# Patient Record
Sex: Female | Born: 1955 | Race: Black or African American | Hispanic: No | Marital: Married | Smoking: Former smoker
Health system: Southern US, Community
[De-identification: ages and names within clinical notes are randomized; demographics above are authoritative.]

## PROBLEM LIST (undated history)

## (undated) DIAGNOSIS — T7840XA Allergy, unspecified, initial encounter: Secondary | ICD-10-CM

## (undated) HISTORY — PX: FOOT SURGERY: SHX648

## (undated) HISTORY — DX: Allergy, unspecified, initial encounter: T78.40XA

---

## 2001-06-27 HISTORY — PX: ABDOMINAL HYSTERECTOMY: SHX81

## 2003-06-28 HISTORY — PX: BACK SURGERY: SHX140

## 2004-04-23 ENCOUNTER — Ambulatory Visit: Payer: Self-pay | Admitting: Specialist

## 2006-01-11 ENCOUNTER — Emergency Department: Payer: Self-pay | Admitting: Emergency Medicine

## 2006-02-02 ENCOUNTER — Encounter: Payer: Self-pay | Admitting: Emergency Medicine

## 2007-04-12 ENCOUNTER — Encounter: Payer: Self-pay | Admitting: Rheumatology

## 2007-04-28 ENCOUNTER — Encounter: Payer: Self-pay | Admitting: Rheumatology

## 2007-09-10 ENCOUNTER — Ambulatory Visit: Payer: Self-pay | Admitting: Internal Medicine

## 2012-03-27 ENCOUNTER — Ambulatory Visit: Payer: Self-pay

## 2012-09-17 ENCOUNTER — Encounter: Payer: Self-pay | Admitting: Sports Medicine

## 2012-09-25 ENCOUNTER — Encounter: Payer: Self-pay | Admitting: Sports Medicine

## 2012-10-25 ENCOUNTER — Encounter: Payer: Self-pay | Admitting: Sports Medicine

## 2013-05-21 ENCOUNTER — Ambulatory Visit: Payer: Self-pay | Admitting: Family Medicine

## 2013-06-17 ENCOUNTER — Ambulatory Visit: Payer: Self-pay | Admitting: Family Medicine

## 2013-09-26 ENCOUNTER — Ambulatory Visit: Payer: Self-pay | Admitting: Family Medicine

## 2014-04-21 ENCOUNTER — Ambulatory Visit: Payer: Self-pay | Admitting: Family Medicine

## 2014-04-27 ENCOUNTER — Ambulatory Visit: Payer: Self-pay | Admitting: Family Medicine

## 2014-05-19 ENCOUNTER — Ambulatory Visit: Payer: Self-pay | Admitting: Family Medicine

## 2014-05-27 ENCOUNTER — Ambulatory Visit: Payer: Self-pay | Admitting: Family Medicine

## 2014-09-17 ENCOUNTER — Ambulatory Visit (INDEPENDENT_AMBULATORY_CARE_PROVIDER_SITE_OTHER): Payer: 59

## 2014-09-17 ENCOUNTER — Ambulatory Visit: Payer: Self-pay | Admitting: Family Medicine

## 2014-09-17 VITALS — BP 146/82 | HR 68 | Resp 15

## 2014-09-17 DIAGNOSIS — M79673 Pain in unspecified foot: Secondary | ICD-10-CM | POA: Diagnosis not present

## 2014-09-17 DIAGNOSIS — M722 Plantar fascial fibromatosis: Secondary | ICD-10-CM | POA: Diagnosis not present

## 2014-09-17 DIAGNOSIS — M7730 Calcaneal spur, unspecified foot: Secondary | ICD-10-CM | POA: Diagnosis not present

## 2014-09-17 MED ORDER — MELOXICAM 15 MG PO TABS: 15.0000 mg | ORAL_TABLET | Freq: Every day | ORAL | Status: DC

## 2014-09-17 NOTE — Progress Notes (Signed)
   Subjective:    Patient ID: Diane Case, female    DOB: [birth year], 59 y.o.   MRN: [id number]  HPI Pt presents with severe pain in her right heel causing her gait problems, has received injections in the past along with splints   Review of Systems  Musculoskeletal: Positive for gait problem.  Allergic/Immunologic: Positive for environmental allergies.  All other systems reviewed and are negative.      Objective:   Physical Exam 59 year old [place] American female well-developed well-nourished oriented 3 presents this time with a long-standing history and recurrent history of heel and arch pain. Currently her right heel is exquisitely painful tendon symptomatic patients had previous injections night splints notorious all of which provided some temporary relief has had recurrence and is referred here by Dr. Grandville Silos for possible surgical consult. However on visit should the patient is wearing a very flimsy pair of flats at work also is wearing Barrett is going barefoot around the house not wearing any shoes at all times. This is likely causing the recurrent plantar fasciitis and from biomechanical standpoint has not been addressed. Objective findings reveal vascular status to be intact pedal pulses palpable DP +2 PT +1 over 4E refill time 3 seconds all digits epicritic and proprioceptive sensations intact and symmetric bilateral there is normal plantar response DTRs not elicited dermatologic the skin color pigment normal hair growth absent nails, criptotic on exam there is pain with palpation of the medial band plantar fascia left heel and mid arch over the right foot inferior healed intertrochanteric area significantly more painful tendon symptomatic to no pain on first step in the morning or getting of 5. Breast patient's x-rays are reviewed indicating a well-developed spur on the inferior left heel fascial thickening on both heels and arches are identified. No fracture cysts tumors rectus  foot otherwise identified. Dermatologic his skin color pigment normal no open wounds no ulcers no other abnormalities identified       Assessment & Plan:  Assessment this time is plantar fasciitis/heel spur syndrome right foot more significant and acute however left foot also having some symptoms at this time on palpation plan at this time having reviewed x-rays and symptoms were discussed with patient surgical options however I advised that even with surgery she will need to have orthoses and biomechanical support afterwards patient this time is a candidate for additional evaluation prior to consenting to surgery we will do a trial the fascial strapping to assess benefits of biomechanical support and orthotic. At this time fascial strapping applied to both feet patient is given a prescription for meloxicam 15 mg once daily recommend ice to the heels rechecked within 1 week beneficial will and orthotics are comfortable get appropriate OTC or prescription orthoses for the patient in the future. If she continues to have difficulty she will be a candidate for surgical intervention may elect to consider conservative course of orthoses prior to proceeding with surgery if needed. Fascial strapping applied to both feet recheck in one week for follow-up  Harriet Masson DPM

## 2014-09-17 NOTE — Patient Instructions (Signed)
ICE INSTRUCTIONS  Apply ice or cold pack to the affected area at least 3 times a day for 10-15 minutes each time.  You should also use ice after prolonged activity or vigorous exercise.  Do not apply ice longer than 20 minutes at one time.  Always keep a cloth between your skin and the ice pack to prevent burns.  Being consistent and following these instructions will help control your symptoms.  We suggest you purchase a gel ice pack because they are reusable and do bit leak.  Some of them are designed to wrap around the area.  Use the method that works best for you.  Here are some other suggestions for icing.   Use a frozen bag of peas or corn-inexpensive and molds well to your body, usually stays frozen for 10 to 20 minutes.  Wet a towel with cold water and squeeze out the excess until it's damp.  Place in a bag in the freezer for 20 minutes. Then remove and use.  Maintaining shoes at all times and see her using crocs around the house  Recommended athletic shoes such as new balance Asics running shoes or Brooks running shoes

## 2014-09-23 ENCOUNTER — Ambulatory Visit: Payer: 59

## 2014-09-23 ENCOUNTER — Ambulatory Visit: Payer: Self-pay | Admitting: Family Medicine

## 2014-09-25 ENCOUNTER — Ambulatory Visit
Admit: 2014-09-25 | Disposition: A | Discharge status: Home / Self Care | Payer: Self-pay | Attending: Family Medicine | Admitting: Family Medicine

## 2014-09-30 ENCOUNTER — Ambulatory Visit: Payer: 59

## 2014-10-15 ENCOUNTER — Ambulatory Visit: Payer: 59 | Admitting: Podiatry

## 2014-10-20 LAB — SURGICAL PATHOLOGY

## 2014-10-31 ENCOUNTER — Ambulatory Visit (INDEPENDENT_AMBULATORY_CARE_PROVIDER_SITE_OTHER): Payer: 59 | Admitting: Podiatry

## 2014-10-31 ENCOUNTER — Ambulatory Visit (INDEPENDENT_AMBULATORY_CARE_PROVIDER_SITE_OTHER): Payer: 59

## 2014-10-31 VITALS — BP 130/77 | HR 60 | Resp 15

## 2014-10-31 DIAGNOSIS — R52 Pain, unspecified: Secondary | ICD-10-CM

## 2014-10-31 DIAGNOSIS — M722 Plantar fascial fibromatosis: Secondary | ICD-10-CM | POA: Diagnosis not present

## 2014-10-31 MED ORDER — METHYLPREDNISOLONE 4 MG PO TBPK: ORAL_TABLET | ORAL | Status: DC

## 2014-10-31 NOTE — Patient Instructions (Signed)
Plantar Fasciitis (Heel Spur Syndrome) with Rehab The plantar fascia is a fibrous, ligament-like, soft-tissue structure that spans the bottom of the foot. Plantar fasciitis is a condition that causes pain in the foot due to inflammation of the tissue. SYMPTOMS   Pain and tenderness on the underneath side of the foot.  Pain that worsens with standing or walking. CAUSES  Plantar fasciitis is caused by irritation and injury to the plantar fascia on the underneath side of the foot. Common mechanisms of injury include:  Direct trauma to bottom of the foot.  Damage to a small nerve that runs under the foot where the main fascia attaches to the heel bone.  Stress placed on the plantar fascia due to bone spurs. RISK INCREASES WITH:   Activities that place stress on the plantar fascia (running, jumping, pivoting, or cutting).  Poor strength and flexibility.  Improperly fitted shoes.  Tight calf muscles.  Flat feet.  Failure to warm-up properly before activity.  Obesity. PREVENTION  Warm up and stretch properly before activity.  Allow for adequate recovery between workouts.  Maintain physical fitness:  Strength, flexibility, and endurance.  Cardiovascular fitness.  Maintain a health body weight.  Avoid stress on the plantar fascia.  Wear properly fitted shoes, including arch supports for individuals who have flat feet. PROGNOSIS  If treated properly, then the symptoms of plantar fasciitis usually resolve without surgery. However, occasionally surgery is necessary. RELATED COMPLICATIONS   Recurrent symptoms that may result in a chronic condition.  Problems of the lower back that are caused by compensating for the injury, such as limping.  Pain or weakness of the foot during push-off following surgery.  Chronic inflammation, scarring, and partial or complete fascia tear, occurring more often from repeated injections. TREATMENT  Treatment initially involves the use of  ice and medication to help reduce pain and inflammation. The use of strengthening and stretching exercises may help reduce pain with activity, especially stretches of the Achilles tendon. These exercises may be performed at home or with a therapist. Your caregiver may recommend that you use heel cups of arch supports to help reduce stress on the plantar fascia. Occasionally, corticosteroid injections are given to reduce inflammation. If symptoms persist for greater than 6 months despite non-surgical (conservative), then surgery may be recommended.  MEDICATION   If pain medication is necessary, then nonsteroidal anti-inflammatory medications, such as aspirin and ibuprofen, or other minor pain relievers, such as acetaminophen, are often recommended.  Do not take pain medication within 7 days before surgery.  Prescription pain relievers may be given if deemed necessary by your caregiver. Use only as directed and only as much as you need.  Corticosteroid injections may be given by your caregiver. These injections should be reserved for the most serious cases, because they may only be given a certain number of times. HEAT AND COLD  Cold treatment (icing) relieves pain and reduces inflammation. Cold treatment should be applied for 10 to 15 minutes every 2 to 3 hours for inflammation and pain and immediately after any activity that aggravates your symptoms. Use ice packs or massage the area with a piece of ice (ice massage).  Heat treatment may be used prior to performing the stretching and strengthening activities prescribed by your caregiver, physical therapist, or athletic trainer. Use a heat pack or soak the injury in warm water. SEEK IMMEDIATE MEDICAL CARE IF:  Treatment seems to offer no benefit, or the condition worsens.  Any medications produce adverse side effects. EXERCISES RANGE   OF MOTION (ROM) AND STRETCHING EXERCISES - Plantar Fasciitis (Heel Spur Syndrome) These exercises may help you  when beginning to rehabilitate your injury. Your symptoms may resolve with or without further involvement from your physician, physical therapist or athletic trainer. While completing these exercises, remember:   Restoring tissue flexibility helps normal motion to return to the joints. This allows healthier, less painful movement and activity.  An effective stretch should be held for at least 30 seconds.  A stretch should never be painful. You should only feel a gentle lengthening or release in the stretched tissue. RANGE OF MOTION - Toe Extension, Flexion  Sit with your right / left leg crossed over your opposite knee.  Grasp your toes and gently pull them back toward the top of your foot. You should feel a stretch on the bottom of your toes and/or foot.  Hold this stretch for __________ seconds.  Now, gently pull your toes toward the bottom of your foot. You should feel a stretch on the top of your toes and or foot.  Hold this stretch for __________ seconds. Repeat __________ times. Complete this stretch __________ times per day.  RANGE OF MOTION - Ankle Dorsiflexion, Active Assisted  Remove shoes and sit on a chair that is preferably not on a carpeted surface.  Place right / left foot under knee. Extend your opposite leg for support.  Keeping your heel down, slide your right / left foot back toward the chair until you feel a stretch at your ankle or calf. If you do not feel a stretch, slide your bottom forward to the edge of the chair, while still keeping your heel down.  Hold this stretch for __________ seconds. Repeat __________ times. Complete this stretch __________ times per day.  STRETCH - Gastroc, Standing  Place hands on wall.  Extend right / left leg, keeping the front knee somewhat bent.  Slightly point your toes inward on your back foot.  Keeping your right / left heel on the floor and your knee straight, shift your weight toward the wall, not allowing your back to  arch.  You should feel a gentle stretch in the right / left calf. Hold this position for __________ seconds. Repeat __________ times. Complete this stretch __________ times per day. STRETCH - Soleus, Standing  Place hands on wall.  Extend right / left leg, keeping the other knee somewhat bent.  Slightly point your toes inward on your back foot.  Keep your right / left heel on the floor, bend your back knee, and slightly shift your weight over the back leg so that you feel a gentle stretch deep in your back calf.  Hold this position for __________ seconds. Repeat __________ times. Complete this stretch __________ times per day. STRETCH - Gastrocsoleus, Standing  Note: This exercise can place a lot of stress on your foot and ankle. Please complete this exercise only if specifically instructed by your caregiver.   Place the ball of your right / left foot on a step, keeping your other foot firmly on the same step.  Hold on to the wall or a rail for balance.  Slowly lift your other foot, allowing your body weight to press your heel down over the edge of the step.  You should feel a stretch in your right / left calf.  Hold this position for __________ seconds.  Repeat this exercise with a slight bend in your right / left knee. Repeat __________ times. Complete this stretch __________ times per day.    STRENGTHENING EXERCISES - Plantar Fasciitis (Heel Spur Syndrome)  These exercises may help you when beginning to rehabilitate your injury. They may resolve your symptoms with or without further involvement from your physician, physical therapist or athletic trainer. While completing these exercises, remember:   Muscles can gain both the endurance and the strength needed for everyday activities through controlled exercises.  Complete these exercises as instructed by your physician, physical therapist or athletic trainer. Progress the resistance and repetitions only as guided. STRENGTH -  Towel Curls  Sit in a chair positioned on a non-carpeted surface.  Place your foot on a towel, keeping your heel on the floor.  Pull the towel toward your heel by only curling your toes. Keep your heel on the floor.  If instructed by your physician, physical therapist or athletic trainer, add ____________________ at the end of the towel. Repeat __________ times. Complete this exercise __________ times per day. STRENGTH - Ankle Inversion  Secure one end of a rubber exercise band/tubing to a fixed object (table, pole). Loop the other end around your foot just before your toes.  Place your fists between your knees. This will focus your strengthening at your ankle.  Slowly, pull your big toe up and in, making sure the band/tubing is positioned to resist the entire motion.  Hold this position for __________ seconds.  Have your muscles resist the band/tubing as it slowly pulls your foot back to the starting position. Repeat __________ times. Complete this exercises __________ times per day.  Document Released: 06/13/2005 Document Revised: 09/05/2011 Document Reviewed: 09/25/2008 [employer] Patient Information 2015 [employer]. This information is not intended to replace advice given to you by your health care provider. Make sure you discuss any questions you have with your health care provider.  

## 2014-11-02 NOTE — Progress Notes (Signed)
Patient ID: Diane Case, female   DOB: 06-29-1955, 59 y.o.   MRN: [id number]  Subjective: 59 year old female presents the office they for follow-up evaluation of bilateral heel pain. She states that she continues have some pain to the bottom of her heels. She states that she continues on meloxicam which seems to help as well as stretching which seems to help as the area feels tight. She denies any numbness or tingling in the pain does not wake her up at night. She states that she has pain particularly morning or after periods of activity which is relieved by ambulation although she has gained appointment she is having pain more consistently. She has been icing the area intermittently. Denies any history of injury or trauma. Denies any systemic complaints such as fevers, chills, nausea, vomiting. No acute changes since last appointment, and no other complaints at this time.   Objective: AAO x3, NAD DP/PT pulses palpable bilaterally, CRT less than 3 seconds Protective sensation intact with Simms Weinstein monofilament, vibratory sensation intact, Achilles tendon reflex intact Tenderness to palpation overlying the plantar medial tubercle of the calcaneus to bilateral heels at the insertion of the plantar fascia. There is no pain along the course of plantar fascial within the arch of the foot however the plantar fascia appears intact. There is no pain with lateral compression of the calcaneus or pain the vibratory sensation. No pain on the posterior aspect of the calcaneus or along the course/insertion of the Achilles tendon. There is no overlying edema, erythema, increase in warmth. There is moderate equinus present and there is tenderness to the plantar heel contracted dorsiflex the ankle and both the Achilles tendon as well as the plantar fascia appear to be tight. No other areas of tenderness palpation or pain with vibratory sensation to the foot/ankle. MMT 5/5, ROM WNL No open lesions or pre-ulcerative  lesions are identified. No pain with calf compression, swelling, warmth, erythema.  Assessment: 59 year old female with bilateral heel pain, likely plantar fasciitis  Plan: -Previous x-rays were reviewed with the patient. -All treatment options discussed with the patient including all alternatives, risks, complications.  -I discussed steroid injections to the area however patient wishes to hold off. -Strongly encouraged regular stretching activities as both the Achilles tendon in the plantar fascia appear to be significantly tightened. Dispensed night splint. -Continue plantar fascial brace. I did discuss with her orthotics again today. -Ice to the area. -Prescribed Medrol Dosepak. -Hold off on on anti-inflammatories while taking medrol dose pack. -Patient encouraged to call the office with any questions, concerns, change in symptoms.

## 2014-11-11 ENCOUNTER — Ambulatory Visit (INDEPENDENT_AMBULATORY_CARE_PROVIDER_SITE_OTHER): Payer: 59 | Admitting: Podiatry

## 2014-11-11 ENCOUNTER — Encounter: Payer: Self-pay | Admitting: Podiatry

## 2014-11-11 VITALS — Ht 65.0 in | Wt 236.0 lb

## 2014-11-11 DIAGNOSIS — M722 Plantar fascial fibromatosis: Secondary | ICD-10-CM | POA: Diagnosis not present

## 2014-11-11 NOTE — Patient Instructions (Signed)
Plantar Fasciitis (Heel Spur Syndrome) with Rehab The plantar fascia is a fibrous, ligament-like, soft-tissue structure that spans the bottom of the foot. Plantar fasciitis is a condition that causes pain in the foot due to inflammation of the tissue. SYMPTOMS   Pain and tenderness on the underneath side of the foot.  Pain that worsens with standing or walking. CAUSES  Plantar fasciitis is caused by irritation and injury to the plantar fascia on the underneath side of the foot. Common mechanisms of injury include:  Direct trauma to bottom of the foot.  Damage to a small nerve that runs under the foot where the main fascia attaches to the heel bone.  Stress placed on the plantar fascia due to bone spurs. RISK INCREASES WITH:   Activities that place stress on the plantar fascia (running, jumping, pivoting, or cutting).  Poor strength and flexibility.  Improperly fitted shoes.  Tight calf muscles.  Flat feet.  Failure to warm-up properly before activity.  Obesity. PREVENTION  Warm up and stretch properly before activity.  Allow for adequate recovery between workouts.  Maintain physical fitness:  Strength, flexibility, and endurance.  Cardiovascular fitness.  Maintain a health body weight.  Avoid stress on the plantar fascia.  Wear properly fitted shoes, including arch supports for individuals who have flat feet. PROGNOSIS  If treated properly, then the symptoms of plantar fasciitis usually resolve without surgery. However, occasionally surgery is necessary. RELATED COMPLICATIONS   Recurrent symptoms that may result in a chronic condition.  Problems of the lower back that are caused by compensating for the injury, such as limping.  Pain or weakness of the foot during push-off following surgery.  Chronic inflammation, scarring, and partial or complete fascia tear, occurring more often from repeated injections. TREATMENT  Treatment initially involves the use of  ice and medication to help reduce pain and inflammation. The use of strengthening and stretching exercises may help reduce pain with activity, especially stretches of the Achilles tendon. These exercises may be performed at home or with a therapist. Your caregiver may recommend that you use heel cups of arch supports to help reduce stress on the plantar fascia. Occasionally, corticosteroid injections are given to reduce inflammation. If symptoms persist for greater than 6 months despite non-surgical (conservative), then surgery may be recommended.  MEDICATION   If pain medication is necessary, then nonsteroidal anti-inflammatory medications, such as aspirin and ibuprofen, or other minor pain relievers, such as acetaminophen, are often recommended.  Do not take pain medication within 7 days before surgery.  Prescription pain relievers may be given if deemed necessary by your caregiver. Use only as directed and only as much as you need.  Corticosteroid injections may be given by your caregiver. These injections should be reserved for the most serious cases, because they may only be given a certain number of times. HEAT AND COLD  Cold treatment (icing) relieves pain and reduces inflammation. Cold treatment should be applied for 10 to 15 minutes every 2 to 3 hours for inflammation and pain and immediately after any activity that aggravates your symptoms. Use ice packs or massage the area with a piece of ice (ice massage).  Heat treatment may be used prior to performing the stretching and strengthening activities prescribed by your caregiver, physical therapist, or athletic trainer. Use a heat pack or soak the injury in warm water. SEEK IMMEDIATE MEDICAL CARE IF:  Treatment seems to offer no benefit, or the condition worsens.  Any medications produce adverse side effects. EXERCISES RANGE   OF MOTION (ROM) AND STRETCHING EXERCISES - Plantar Fasciitis (Heel Spur Syndrome) These exercises may help you  when beginning to rehabilitate your injury. Your symptoms may resolve with or without further involvement from your physician, physical therapist or athletic trainer. While completing these exercises, remember:   Restoring tissue flexibility helps normal motion to return to the joints. This allows healthier, less painful movement and activity.  An effective stretch should be held for at least 30 seconds.  A stretch should never be painful. You should only feel a gentle lengthening or release in the stretched tissue. RANGE OF MOTION - Toe Extension, Flexion  Sit with your right / left leg crossed over your opposite knee.  Grasp your toes and gently pull them back toward the top of your foot. You should feel a stretch on the bottom of your toes and/or foot.  Hold this stretch for __________ seconds.  Now, gently pull your toes toward the bottom of your foot. You should feel a stretch on the top of your toes and or foot.  Hold this stretch for __________ seconds. Repeat __________ times. Complete this stretch __________ times per day.  RANGE OF MOTION - Ankle Dorsiflexion, Active Assisted  Remove shoes and sit on a chair that is preferably not on a carpeted surface.  Place right / left foot under knee. Extend your opposite leg for support.  Keeping your heel down, slide your right / left foot back toward the chair until you feel a stretch at your ankle or calf. If you do not feel a stretch, slide your bottom forward to the edge of the chair, while still keeping your heel down.  Hold this stretch for __________ seconds. Repeat __________ times. Complete this stretch __________ times per day.  STRETCH - Gastroc, Standing  Place hands on wall.  Extend right / left leg, keeping the front knee somewhat bent.  Slightly point your toes inward on your back foot.  Keeping your right / left heel on the floor and your knee straight, shift your weight toward the wall, not allowing your back to  arch.  You should feel a gentle stretch in the right / left calf. Hold this position for __________ seconds. Repeat __________ times. Complete this stretch __________ times per day. STRETCH - Soleus, Standing  Place hands on wall.  Extend right / left leg, keeping the other knee somewhat bent.  Slightly point your toes inward on your back foot.  Keep your right / left heel on the floor, bend your back knee, and slightly shift your weight over the back leg so that you feel a gentle stretch deep in your back calf.  Hold this position for __________ seconds. Repeat __________ times. Complete this stretch __________ times per day. STRETCH - Gastrocsoleus, Standing  Note: This exercise can place a lot of stress on your foot and ankle. Please complete this exercise only if specifically instructed by your caregiver.   Place the ball of your right / left foot on a step, keeping your other foot firmly on the same step.  Hold on to the wall or a rail for balance.  Slowly lift your other foot, allowing your body weight to press your heel down over the edge of the step.  You should feel a stretch in your right / left calf.  Hold this position for __________ seconds.  Repeat this exercise with a slight bend in your right / left knee. Repeat __________ times. Complete this stretch __________ times per day.    STRENGTHENING EXERCISES - Plantar Fasciitis (Heel Spur Syndrome)  These exercises may help you when beginning to rehabilitate your injury. They may resolve your symptoms with or without further involvement from your physician, physical therapist or athletic trainer. While completing these exercises, remember:   Muscles can gain both the endurance and the strength needed for everyday activities through controlled exercises.  Complete these exercises as instructed by your physician, physical therapist or athletic trainer. Progress the resistance and repetitions only as guided. STRENGTH -  Towel Curls  Sit in a chair positioned on a non-carpeted surface.  Place your foot on a towel, keeping your heel on the floor.  Pull the towel toward your heel by only curling your toes. Keep your heel on the floor.  If instructed by your physician, physical therapist or athletic trainer, add ____________________ at the end of the towel. Repeat __________ times. Complete this exercise __________ times per day. STRENGTH - Ankle Inversion  Secure one end of a rubber exercise band/tubing to a fixed object (table, pole). Loop the other end around your foot just before your toes.  Place your fists between your knees. This will focus your strengthening at your ankle.  Slowly, pull your big toe up and in, making sure the band/tubing is positioned to resist the entire motion.  Hold this position for __________ seconds.  Have your muscles resist the band/tubing as it slowly pulls your foot back to the starting position. Repeat __________ times. Complete this exercises __________ times per day.  Document Released: 06/13/2005 Document Revised: 09/05/2011 Document Reviewed: 09/25/2008 [employer] Patient Information 2015 [employer]. This information is not intended to replace advice given to you by your health care provider. Make sure you discuss any questions you have with your health care provider.  

## 2014-11-18 NOTE — Progress Notes (Signed)
Patient ID: Diane Case, female   DOB: [birth year], 59 y.o.   MRN: [id number]  Subjective: Ms. Wiltse presents the office they for follow-up evaluation of bilateral heel pain, particularly the right. She states that she tries to stretch as much as possible as well as continued icing and continue the night splint. She states that she continues to have pain to her heel particularly on the right side. She denies any swelling or redness. Denies numbness or tingling.  Denies any systemic complaints such as fevers, chills, nausea, vomiting. No acute changes since last appointment, and no other complaints at this time.   Objective: AAO x3, NAD DP/PT pulses palpable bilaterally, CRT less than 3 seconds Protective sensation intact with Simms Weinstein monofilament, vibratory sensation intact, Achilles tendon reflex intact There is continued tenderness to palpation overlying the plantar medial tubercle of the calcaneus to bilateral heels at the insertion of the plantar fascia with the right > left. There is no pain along the course of plantar fascia within the arch of the foot and the plantar fascia appears intact. There is no pain with lateral compression of the calcaneus or pain the vibratory sensation. No pain on the posterior aspect of the calcaneus or along the course/insertion of the Achilles tendon. There is no overlying edema, erythema, increase in warmth. There is moderate equinus present and there is tenderness to the plantar heel when trying to dorsiflex the ankle. Both the Achilles tendon as well as the plantar fascia appear to be tight. No other areas of tenderness palpation or pain with vibratory sensation to the foot/ankle. MMT 5/5, ROM WNL No open lesions or pre-ulcerative lesions are identified. No pain with calf compression, swelling, warmth, erythema.  Assessment: 59 year old female with bilateral heel pain, likely plantar fasciitis  Plan: -All treatment options discussed with the patient  including all alternatives, risks, complications.  -I again discussed steroid injections to the area however patient wishes to hold off. -I do believe the patient would benefit from aggressive stretching exercises as well as iontophoresis. A prescription for physical therapy was given to the patient. -Continue using night splint, plantar fascial brace. -Ice to the area. -Follow-up in 3 weeks or sooner if any problems are to arise. -Patient encouraged to call the office with any questions, concerns, change in symptoms.

## 2014-11-20 ENCOUNTER — Encounter: Payer: Self-pay | Admitting: *Deleted

## 2014-11-27 NOTE — Discharge Instructions (Signed)

## 2014-11-28 ENCOUNTER — Ambulatory Visit
Admission: RE | Admit: 2014-11-28 | Discharge: 2014-11-28 | Disposition: A | Discharge status: Home / Self Care | Payer: 59 | Source: Ambulatory Visit | Attending: Gastroenterology | Admitting: Gastroenterology

## 2014-11-28 ENCOUNTER — Ambulatory Visit: Payer: 59 | Admitting: Anesthesiology

## 2014-11-28 ENCOUNTER — Other Ambulatory Visit: Payer: Self-pay | Admitting: Gastroenterology

## 2014-11-28 ENCOUNTER — Encounter
Admission: RE | Disposition: A | Discharge status: Home / Self Care | Payer: Self-pay | Source: Ambulatory Visit | Attending: Gastroenterology

## 2014-11-28 DIAGNOSIS — Z881 Allergy status to other antibiotic agents status: Secondary | ICD-10-CM | POA: Insufficient documentation

## 2014-11-28 DIAGNOSIS — D125 Benign neoplasm of sigmoid colon: Secondary | ICD-10-CM | POA: Diagnosis not present

## 2014-11-28 DIAGNOSIS — K640 First degree hemorrhoids: Secondary | ICD-10-CM | POA: Diagnosis not present

## 2014-11-28 DIAGNOSIS — Z820 Family history of epilepsy and other diseases of the nervous system: Secondary | ICD-10-CM | POA: Insufficient documentation

## 2014-11-28 DIAGNOSIS — Z882 Allergy status to sulfonamides status: Secondary | ICD-10-CM | POA: Insufficient documentation

## 2014-11-28 DIAGNOSIS — D120 Benign neoplasm of cecum: Secondary | ICD-10-CM | POA: Insufficient documentation

## 2014-11-28 DIAGNOSIS — E669 Obesity, unspecified: Secondary | ICD-10-CM | POA: Diagnosis not present

## 2014-11-28 DIAGNOSIS — Z801 Family history of malignant neoplasm of trachea, bronchus and lung: Secondary | ICD-10-CM | POA: Insufficient documentation

## 2014-11-28 DIAGNOSIS — Z809 Family history of malignant neoplasm, unspecified: Secondary | ICD-10-CM | POA: Insufficient documentation

## 2014-11-28 DIAGNOSIS — Z87891 Personal history of nicotine dependence: Secondary | ICD-10-CM | POA: Diagnosis not present

## 2014-11-28 DIAGNOSIS — Z86010 Personal history of colonic polyps: Secondary | ICD-10-CM | POA: Diagnosis not present

## 2014-11-28 DIAGNOSIS — Z8249 Family history of ischemic heart disease and other diseases of the circulatory system: Secondary | ICD-10-CM | POA: Diagnosis not present

## 2014-11-28 DIAGNOSIS — Z6835 Body mass index (BMI) 35.0-35.9, adult: Secondary | ICD-10-CM | POA: Insufficient documentation

## 2014-11-28 DIAGNOSIS — K5730 Diverticulosis of large intestine without perforation or abscess without bleeding: Secondary | ICD-10-CM | POA: Diagnosis not present

## 2014-11-28 HISTORY — PX: COLONOSCOPY: SHX5424

## 2014-11-28 HISTORY — PX: POLYPECTOMY: SHX149

## 2014-11-28 SURGERY — COLONOSCOPY
Anesthesia: Choice | Wound class: Contaminated

## 2014-11-28 MED ORDER — PROPOFOL 10 MG/ML IV BOLUS
INTRAVENOUS | Status: DC | PRN
  Administered 2014-11-28 (×8): 20 mg via INTRAVENOUS

## 2014-11-28 MED ORDER — LIDOCAINE HCL (CARDIAC) 20 MG/ML IV SOLN
INTRAVENOUS | Status: DC | PRN
  Administered 2014-11-28: 30 mg via INTRAVENOUS

## 2014-11-28 MED ORDER — LACTATED RINGERS IV SOLN
INTRAVENOUS | Status: DC
  Administered 2014-11-28 (×2): via INTRAVENOUS

## 2014-11-28 MED ORDER — STERILE WATER FOR IRRIGATION IR SOLN
Status: DC | PRN
  Administered 2014-11-28: 08:00:00

## 2014-11-28 MED ORDER — ACETAMINOPHEN 160 MG/5ML PO SOLN: 325.0000 mg | ORAL | Status: DC | PRN

## 2014-11-28 MED ORDER — ACETAMINOPHEN 325 MG PO TABS: 325.0000 mg | ORAL_TABLET | ORAL | Status: DC | PRN

## 2014-11-28 SURGICAL SUPPLY — 28 items
CANISTER SUCT 1200ML W/VALVE (MISCELLANEOUS) ×4 IMPLANT
FCP ESCP3.2XJMB 240X2.8X (MISCELLANEOUS)
FORCEPS BIOP RAD 4 LRG CAP 4 (CUTTING FORCEPS) ×2 IMPLANT
FORCEPS BIOP RJ4 240 W/NDL (MISCELLANEOUS)
FORCEPS ESCP3.2XJMB 240X2.8X (MISCELLANEOUS) IMPLANT
GOWN CVR UNV OPN BCK APRN NK (MISCELLANEOUS) ×4 IMPLANT
GOWN ISOL THUMB LOOP REG UNIV (MISCELLANEOUS) ×8
HEMOCLIP INSTINCT (CLIP) IMPLANT
INJECTOR VARIJECT VIN23 (MISCELLANEOUS) IMPLANT
KIT CO2 TUBING (TUBING) IMPLANT
KIT DEFENDO VALVE AND CONN (KITS) IMPLANT
KIT ENDO PROCEDURE OLY (KITS) ×4 IMPLANT
LIGATOR MULTIBAND 6SHOOTER MBL (MISCELLANEOUS) IMPLANT
MARKER SPOT ENDO TATTOO 5ML (MISCELLANEOUS) IMPLANT
PAD GROUND ADULT SPLIT (MISCELLANEOUS) IMPLANT
SNARE SHORT THROW 13M SML OVAL (MISCELLANEOUS) ×2 IMPLANT
SNARE SHORT THROW 30M LRG OVAL (MISCELLANEOUS) IMPLANT
SPOT EX ENDOSCOPIC TATTOO (MISCELLANEOUS)
SUCTION POLY TRAP 4CHAMBER (MISCELLANEOUS) IMPLANT
TRAP SUCTION POLY (MISCELLANEOUS) ×2 IMPLANT
TUBING CONN 6MMX3.1M (TUBING)
TUBING SUCTION CONN 0.25 STRL (TUBING) IMPLANT
UNDERPAD 30X60 958B10 (PK) (MISCELLANEOUS) IMPLANT
VALVE BIOPSY ENDO (VALVE) IMPLANT
VARIJECT INJECTOR VIN23 (MISCELLANEOUS)
WATER AUXILLARY (MISCELLANEOUS) IMPLANT
WATER STERILE IRR 250ML POUR (IV SOLUTION) ×4 IMPLANT
WATER STERILE IRR 500ML POUR (IV SOLUTION) IMPLANT

## 2014-11-28 NOTE — Op Note (Signed)
[facility] Gastroenterology Patient Name: Diane Case Procedure Date: 11/28/2014 7:51 AM MRN: [id number] Account #: [id number] Date of Birth: May 24, 1956 Admit Type: Outpatient Age: 59 Room: [facility] OR ROOM 01 Gender: Female Note Status: Finalized Procedure:         Colonoscopy Indications:       High risk colon cancer surveillance: Personal history of                     colonic polyps Providers:         Lucilla Lame, MD Referring MD:      Otila Back. Manuella Ghazi (Referring MD) Medicines:         Propofol per Anesthesia Complications:     No immediate complications. Procedure:         Pre-Anesthesia Assessment:                    - Prior to the procedure, a History and Physical was                     performed, and patient medications and allergies were                     reviewed. The patient's tolerance of previous anesthesia                     was also reviewed. The risks and benefits of the procedure                     and the sedation options and risks were discussed with the                     patient. All questions were answered, and informed consent                     was obtained. Prior Anticoagulants: The patient has taken                     no previous anticoagulant or antiplatelet agents. ASA                     Grade Assessment: II - A patient with mild systemic                     disease. After reviewing the risks and benefits, the                     patient was deemed in satisfactory condition to undergo                     the procedure.                    After obtaining informed consent, the colonoscope was                     passed under direct vision. Throughout the procedure, the                     patient's blood pressure, pulse, and oxygen saturations                     were monitored continuously. The was introduced through  the anus and advanced to the the cecum, identified by                     appendiceal orifice and  ileocecal valve. The colonoscopy                     was performed without difficulty. The patient tolerated                     the procedure well. The quality of the bowel preparation                     was excellent. Findings:      The perianal and digital rectal examinations were normal.      Two sessile polyps were found in the cecum. The polyps were 2 to 4 mm in       size. These polyps were removed with a cold snare. Resection and       retrieval were complete.      Two sessile polyps were found in the sigmoid colon. The polyps were 2 to       3 mm in size. These polyps were removed with a cold biopsy forceps.       Resection and retrieval were complete.      Multiple small-mouthed diverticula were found in the sigmoid colon.      Non-bleeding internal hemorrhoids were found during retroflexion. The       hemorrhoids were Grade I (internal hemorrhoids that do not prolapse). Impression:        - Two 2 to 4 mm polyps in the cecum. Resected and                     retrieved.                    - Two 2 to 3 mm polyps in the sigmoid colon. Resected and                     retrieved.                    - Diverticulosis in the sigmoid colon.                    - Non-bleeding internal hemorrhoids. Recommendation:    - Await pathology results.                    - Repeat colonoscopy in 5 years for surveillance. Procedure Code(s): --- Professional ---                    [phone], Colonoscopy, flexible; with removal of tumor(s),                     polyp(s), or other lesion(s) by snare technique                    [id number], 23, Colonoscopy, flexible; with biopsy, single or                     multiple Diagnosis Code(s): --- Professional ---                    Z86.010, Personal history of colonic polyps  D12.0, Benign neoplasm of cecum                    D12.5, Benign neoplasm of sigmoid colon CPT copyright 2014 [employer]. All rights reserved. The codes  documented in this report are preliminary and upon coder review may  be revised to meet current compliance requirements. Lucilla Lame, MD 11/28/2014 8:17:17 AM This report has been signed electronically. Number of Addenda: 0 Note Initiated On: 11/28/2014 7:51 AM Scope Withdrawal Time: 0 hours 8 minutes 12 seconds  Total Procedure Duration: 0 hours 11 minutes 24 seconds       [facility]

## 2014-11-28 NOTE — Anesthesia Postprocedure Evaluation (Signed)
  Anesthesia Post-op Note  Patient: Diane Case  Procedure(s) Performed: Procedure(s): COLONOSCOPY (N/A)  Anesthesia type:No value filed.  Patient location: PACU  Post pain: Pain level controlled  Post assessment: Post-op Vital signs reviewed, Patient's Cardiovascular Status Stable, Respiratory Function Stable, Patent Airway and No signs of Nausea or vomiting  Post vital signs: Reviewed and stable  Last Vitals:  Filed Vitals:   11/28/14 0836  BP:   Pulse: 64  Temp:   Resp: 20    Level of consciousness: awake, alert  and patient cooperative  Complications: No apparent anesthesia complications

## 2014-11-28 NOTE — Anesthesia Preprocedure Evaluation (Signed)
Anesthesia Evaluation  Patient identified by MRN, date of birth, ID band  Reviewed: Allergy & Precautions, H&P , NPO status , Patient's Chart, lab work & pertinent test results  Airway Mallampati: I  TM Distance: >3 FB Neck ROM: full    Dental no notable dental hx. (+) Partial Upper   Pulmonary former smoker,    Pulmonary exam normal       Cardiovascular Rhythm:regular Rate:Normal     Neuro/Psych    GI/Hepatic   Endo/Other    Renal/GU      Musculoskeletal   Abdominal   Peds  Hematology   Anesthesia Other Findings   Reproductive/Obstetrics                             Anesthesia Physical Anesthesia Plan  ASA: I  Anesthesia Plan:    Post-op Pain Management:    Induction:   Airway Management Planned:   Additional Equipment:   Intra-op Plan:   Post-operative Plan:   Informed Consent: I have reviewed the patients History and Physical, chart, labs and discussed the procedure including the risks, benefits and alternatives for the proposed anesthesia with the patient or authorized representative who has indicated his/her understanding and acceptance.     Plan Discussed with: CRNA  Anesthesia Plan Comments:         Anesthesia Quick Evaluation

## 2014-11-28 NOTE — H&P (Signed)
  [facility]  [address] [place], [place] [address] Phone: [phone] Fax : [phone]  Primary Care Physician:  Pcp Not In System Primary Gastroenterologist:  Dr. Allen Norris  Pre-Procedure History & Physical: HPI:  Diane Case is a 59 y.o. female is here for an colonoscopy.   History reviewed. No pertinent past medical history.  History reviewed. No pertinent past surgical history.  Prior to Admission medications   Medication Sig Start Date End Date Taking? Authorizing Provider  Ascorbic Acid (VITAMIN C) 100 MG tablet Take 100 mg by mouth daily.   Yes Historical Provider, MD  BESIVANCE 0.6 % SUSP  10/22/14  Yes Historical Provider, MD  Biotin 1 MG CAPS Take by mouth.   Yes Historical Provider, MD  DUREZOL 0.05 % EMUL  10/22/14  Yes Historical Provider, MD  ILEVRO 0.3 % ophthalmic suspension  10/22/14  Yes Historical Provider, MD  Nutritional Supplements (NUTRITIONAL SUPPLEMENT PO) Take 1 tablet by mouth daily.   Yes Historical Provider, MD  Vitamin D, Ergocalciferol, (DRISDOL) 50000 UNITS CAPS capsule  10/22/14  Yes Historical Provider, MD    Allergies as of 11/07/2014 - Review Complete 10/31/2014  Allergen Reaction Noted  . Sulfa antibiotics Rash 09/17/2014    History reviewed. No pertinent family history.  History   Social History  . Marital Status: Married    Spouse Name: N/A  . Number of Children: N/A  . Years of Education: N/A   Occupational History  . Not on file.   Social History Main Topics  . Smoking status: Former Smoker    Types: Cigarettes    Quit date: 11/20/2002  . Smokeless tobacco: Former Systems developer    Quit date: 11/20/1983  . Alcohol Use: No  . Drug Use: Not on file  . Sexual Activity: Not on file   Other Topics Concern  . Not on file   Social History Narrative    Review of Systems: See HPI, otherwise negative ROS  Physical Exam: BP 126/82 mmHg  Pulse 72  Temp(Src) 97.9 F (36.6 C) (Temporal)  Resp 16  Ht 5\' 5"   (1.651 m)  Wt 234 lb (106.142 kg)  BMI 38.94 kg/m2  SpO2 100% General:   Alert,  pleasant and cooperative in NAD Head:  Normocephalic and atraumatic. Neck:  Supple; no masses or thyromegaly. Lungs:  Clear throughout to auscultation.    Heart:  Regular rate and rhythm. Abdomen:  Soft, nontender and nondistended. Normal bowel sounds, without guarding, and without rebound.   Neurologic:  Alert and  oriented x4;  grossly normal neurologically.  Impression/Plan: Diane Case is here for an colonoscopy to be performed for history of polyps  Risks, benefits, limitations, and alternatives regarding  colonoscopy have been reviewed with the patient.  Questions have been answered.  All parties agreeable.   Ollen Bowl, MD  11/28/2014, 7:50 AM

## 2014-11-28 NOTE — Transfer of Care (Signed)
Immediate Anesthesia Transfer of Care Note  Patient: Diane Case  Procedure(s) Performed: Procedure(s): COLONOSCOPY (N/A)  Patient Location: PACU  Anesthesia Type: No value filed.  Level of Consciousness: awake, alert  and patient cooperative  Airway and Oxygen Therapy: Patient Spontanous Breathing and Patient connected to supplemental oxygen  Post-op Assessment: Post-op Vital signs reviewed, Patient's Cardiovascular Status Stable, Respiratory Function Stable, Patent Airway and No signs of Nausea or vomiting  Post-op Vital Signs: Reviewed and stable  Complications: No apparent anesthesia complications

## 2014-12-01 ENCOUNTER — Encounter: Payer: Self-pay | Admitting: Gastroenterology

## 2014-12-02 ENCOUNTER — Ambulatory Visit (INDEPENDENT_AMBULATORY_CARE_PROVIDER_SITE_OTHER): Payer: 59 | Admitting: Podiatry

## 2014-12-02 ENCOUNTER — Encounter: Payer: Self-pay | Admitting: Podiatry

## 2014-12-02 VITALS — BP 141/88 | HR 68 | Resp 17

## 2014-12-02 DIAGNOSIS — M722 Plantar fascial fibromatosis: Secondary | ICD-10-CM

## 2014-12-02 NOTE — Progress Notes (Signed)
Patient ID: ABREANNA DRAWDY, female   DOB: 11/16/1955, 59 y.o.   MRN: [id number]  Subjective:  59 year old female presents the office today for follow up evaluation of bilateral heel pain, likely plantar fasciitis. She has been going to physical therapy since last appointment. She does that is helping somewhat but she does continue to have pain to both of her heels. She is also continue the stretching exercises at home as well as using the night splint. She's been taking anti-inflammatory as needed. She denies any swelling or redness and denies any numbness or tingling. She does continue state that she feels that the area is tight. No other complaints at this time in no acute changes since last appointment.  Objective: AAO x3, NAD DP/PT pulses palpable bilaterally, CRT less than 3 seconds Protective sensation intact with Simms Weinstein monofilament, vibratory sensation intact, Achilles tendon reflex intact There is continued tenderness to palpation overlying the plantar medial tubercle of the calcaneus to bilateral heels at the insertion of the plantar fascia with the right >>left although it does appear to be somewhat improved. There is no pain along the course of plantar fascial within the arch of the foot. There is no pain with lateral compression of the calcaneus or pain the vibratory sensation. No pain on the posterior aspect of the calcaneus or along the course/insertion of the Achilles tendon. There is moderate equinus bilaterally. There is no overlying edema, erythema, increase in warmth. No other areas of tenderness palpation or pain with vibratory sensation to the foot/ankle. MMT 5/5, ROM WNL No open lesions or pre-ulcerative lesions are identified. No pain with calf compression, swelling, warmth, erythema.  Assessment:  59 year old female with continued bilateral heel pain, plantar fasciitis although somewhat  Improved.   Plan: -Treatment options discussed including all alternatives,  risks, and complications - Recommended the patient continue with physical therapy as she does have some improvement.  There is significant tightness to Achilles tendon as well. I believe that she would continue to benefit from these modalities. - also discussed custom versus over-the-counter orthotics. She'll consider her options and call the office if she once to pursue custom orthotics. - plantar fascial tapings were applied today. - follow up after PT or sooner if any problems are to arise. In the meantime encouraged to call the office any questions, concerns, change in symptoms.

## 2014-12-03 DIAGNOSIS — B351 Tinea unguium: Secondary | ICD-10-CM

## 2014-12-08 ENCOUNTER — Encounter: Payer: Self-pay | Admitting: Gastroenterology

## 2014-12-10 ENCOUNTER — Telehealth: Payer: Self-pay

## 2014-12-10 NOTE — Telephone Encounter (Signed)
Pt notified of results

## 2014-12-10 NOTE — Telephone Encounter (Signed)
-----   Message from Lucilla Lame, MD sent at 12/08/2014  3:45 PM EDT ----- Please let the note that the polyp was adenomatous and she needs a repeat colonoscopy in 5 years.

## 2014-12-15 ENCOUNTER — Other Ambulatory Visit: Payer: Self-pay | Admitting: *Deleted

## 2014-12-15 MED ORDER — VITAMIN D (ERGOCALCIFEROL) 1.25 MG (50000 UNIT) PO CAPS: 50000.0000 [IU] | ORAL_CAPSULE | ORAL | Status: DC

## 2015-01-05 ENCOUNTER — Telehealth: Payer: Self-pay | Admitting: *Deleted

## 2015-01-05 NOTE — Telephone Encounter (Signed)
Pt checking on the paperwork faxed in by [facility].

## 2015-01-13 ENCOUNTER — Telehealth: Payer: Self-pay | Admitting: Family Medicine

## 2015-01-13 NOTE — Telephone Encounter (Signed)
Diane Case is requesting refill on Vitamin D2 50,000 units. Please send to [facility].

## 2015-01-13 NOTE — Telephone Encounter (Signed)
Spoke with patient and she states she does not need a refill om Vitamin D because she was sent #30 from mail order so she has enough and she will call to schedule appointment to have her levels checked.

## 2015-01-19 ENCOUNTER — Other Ambulatory Visit: Payer: Self-pay | Admitting: Family Medicine

## 2015-01-19 MED ORDER — VITAMIN D (ERGOCALCIFEROL) 1.25 MG (50000 UNIT) PO CAPS: 50000.0000 [IU] | ORAL_CAPSULE | ORAL | Status: DC

## 2015-01-19 NOTE — Telephone Encounter (Signed)
Medication has been refilled per Dr. Manuella Ghazi for #2 with no refills patient needs to schedule follow up appt and to have Vitamin D levels checked.

## 2015-01-22 ENCOUNTER — Ambulatory Visit (INDEPENDENT_AMBULATORY_CARE_PROVIDER_SITE_OTHER): Payer: 59 | Admitting: Podiatry

## 2015-01-22 DIAGNOSIS — M722 Plantar fascial fibromatosis: Secondary | ICD-10-CM | POA: Diagnosis not present

## 2015-01-22 DIAGNOSIS — M79673 Pain in unspecified foot: Secondary | ICD-10-CM | POA: Diagnosis not present

## 2015-01-22 NOTE — Patient Instructions (Signed)
Pre-Operative Instructions  Congratulations, you have decided to take an important step to improving your quality of life.  You can be assured that the doctors of [facility] will be with you every step of the way.  1. Plan to be at the surgery center/hospital at least 1 (one) hour prior to your scheduled time unless otherwise directed by the surgical center/hospital staff.  You must have a responsible adult accompany you, remain during the surgery and drive you home.  Make sure you have directions to the surgical center/hospital and know how to get there on time. 2. For hospital based surgery you will need to obtain a history and physical form from your family physician within 1 month prior to the date of surgery- we will give you a form for you primary physician.  3. We make every effort to accommodate the date you request for surgery.  There are however, times where surgery dates or times have to be moved.  We will contact you as soon as possible if a change in schedule is required.   4. No Aspirin/Ibuprofen for one week before surgery.  If you are on aspirin, any non-steroidal anti-inflammatory medications (Mobic, Aleve, Ibuprofen) you should stop taking it 7 days prior to your surgery.  You make take Tylenol  For pain prior to surgery.  5. Medications- If you are taking daily heart and blood pressure medications, seizure, reflux, allergy, asthma, anxiety, pain or diabetes medications, make sure the surgery center/hospital is aware before the day of surgery so they may notify you which medications to take or avoid the day of surgery. 6. No food or drink after midnight the night before surgery unless directed otherwise by surgical center/hospital staff. 7. No alcoholic beverages 24 hours prior to surgery.  No smoking 24 hours prior to or 24 hours after surgery. 8. Wear loose pants or shorts- loose enough to fit over bandages, boots, and casts. 9. No slip on shoes, sneakers are best. 10. Bring  your boot with you to the surgery center/hospital.  Also bring crutches or a walker if your physician has prescribed it for you.  If you do not have this equipment, it will be provided for you after surgery. 11. If you have not been contracted by the surgery center/hospital by the day before your surgery, call to confirm the date and time of your surgery. 12. Leave-time from work may vary depending on the type of surgery you have.  Appropriate arrangements should be made prior to surgery with your employer. 13. Prescriptions will be provided immediately following surgery by your doctor.  Have these filled as soon as possible after surgery and take the medication as directed. 14. Remove nail polish on the operative foot. 15. Wash the night before surgery.  The night before surgery wash the foot and leg well with the antibacterial soap provided and water paying special attention to beneath the toenails and in between the toes.  Rinse thoroughly with water and dry well with a towel.  Perform this wash unless told not to do so by your physician.  Enclosed: 1 Ice pack (please put in freezer the night before surgery)   1 Hibiclens skin cleaner   Pre-op Instructions  If you have any questions regarding the instructions, do not hesitate to call our office.  [place]: [address] [place], [place] [address] [phone]  [place]: [address] [place], [place] [address] [phone]  [place]: [address]  , [place] [address] [phone]  Dr. Richard   Tuchman DPM, Dr. Norman Regal DPM Dr. Richard Sikora DPM, Dr. M. Todd Hyatt DPM, Dr. Kathryn Egerton DPM, Dr. Matthew Wagoner DPM 

## 2015-01-23 ENCOUNTER — Encounter: Payer: Self-pay | Admitting: *Deleted

## 2015-01-23 NOTE — Progress Notes (Signed)
Patient ID: Diane Case, female   DOB: 10-Apr-1956, 59 y.o.   MRN: [id number]  Subjective: 59 year old female presents after a fall evaluation of bilateral heel pain with the right side worse the left. She states that she has finished physical therapy and she had some relief on she is doing however the pain has recurred. She is continuing stretching icing activities. We fused a Medrol Dosepak and anti-inflammatories without any relief. She has tried shoe modifications and over-the-counter inserts. She continues to have pain in her heels which was mostly in the morning of first hours become more consistent throughout the day. She described as a throbbing sensation. Denies any numbness or tingling. Denies any swelling or redness. The pain does not wake up at night. No other complaints at this time.  Objective: AAO x3, NAD DP/PT pulses palpable bilaterally, CRT less than 3 seconds Protective sensation intact with Simms Weinstein monofilament, vibratory sensation intact, Achilles tendon reflex intact There is continued moderate tenderness to palpation overlying the plantar medial tubercle of the calcaneus to the bilateral heels at the insertion of the plantar fascia with the right worse than the left. There is no pain along the course of plantar fascia within the arch of the foot. There is no pain with lateral compression of the calcaneus or pain the vibratory sensation. No pain on the posterior aspect of the calcaneus or along the course/insertion of the Achilles tendon. There is no overlying edema, erythema, increase in warmth. No other areas of tenderness palpation or pain with vibratory sensation to the foot/ankle. MMT 5/5, ROM WNL. Equinus is present.  No open lesions or pre-ulcerative lesions are identified. No pain with calf compression, swelling, warmth, erythema.  Assessment: 59 year old female with continued heel pain despite conservative treatment with the right worse than  left.  Plan: -Treatment options discussed including all alternatives, risks, and complications. Discussed both conservative and surgical treatment options. - At this time she is attended multiple conservative treatment discussed surgical intervention the patient which included right EPF. At this time she is to proceed with surgical intervention to help decrease her pain. She understands that This procedure may not alleviate all of her symptoms. -The incision placement as well as the postoperative course was discussed with the patient. I discussed risks of the surgery which include, but not limited to, infection, bleeding, pain, swelling, need for further surgery, delayed or nonhealing, painful or ugly scar, numbness or sensation changes, over/under correction, recurrence, transfer lesions, further deformity, hardware failure, DVT/PE, loss of toe/foot. Patient understands these risks and wishes to proceed with surgery. The surgical consent was reviewed with the patient all 3 pages were signed. No promises or guarantees were given to the outcome of the procedure. All questions were answered to the best of my ability. Before the surgery the patient was encouraged to call the office if there is any further questions. The surgery will be performed at the [facility] on an outpatient basis.  Celesta Gentile, DPM

## 2015-01-23 NOTE — Progress Notes (Signed)
Authorization # for DOS 03/25/15 is [id number].

## 2015-01-26 ENCOUNTER — Ambulatory Visit
Admission: RE | Admit: 2015-01-26 | Discharge: 2015-01-26 | Disposition: A | Discharge status: Home / Self Care | Payer: 59 | Source: Ambulatory Visit | Attending: Family Medicine | Admitting: Family Medicine

## 2015-01-26 ENCOUNTER — Ambulatory Visit (INDEPENDENT_AMBULATORY_CARE_PROVIDER_SITE_OTHER): Payer: 59 | Admitting: Family Medicine

## 2015-01-26 ENCOUNTER — Encounter: Payer: Self-pay | Admitting: Family Medicine

## 2015-01-26 VITALS — BP 138/82 | HR 85 | Temp 98.3°F | Resp 18 | Ht 65.0 in | Wt 241.8 lb

## 2015-01-26 DIAGNOSIS — M25561 Pain in right knee: Secondary | ICD-10-CM | POA: Diagnosis not present

## 2015-01-26 DIAGNOSIS — M79622 Pain in left upper arm: Secondary | ICD-10-CM | POA: Diagnosis not present

## 2015-01-26 DIAGNOSIS — E668 Other obesity: Secondary | ICD-10-CM | POA: Insufficient documentation

## 2015-01-26 DIAGNOSIS — M542 Cervicalgia: Secondary | ICD-10-CM | POA: Insufficient documentation

## 2015-01-26 DIAGNOSIS — E669 Obesity, unspecified: Secondary | ICD-10-CM | POA: Insufficient documentation

## 2015-01-26 DIAGNOSIS — E559 Vitamin D deficiency, unspecified: Secondary | ICD-10-CM | POA: Diagnosis not present

## 2015-01-26 DIAGNOSIS — M79609 Pain in unspecified limb: Secondary | ICD-10-CM | POA: Insufficient documentation

## 2015-01-26 NOTE — Progress Notes (Signed)
Name: EZELL MELIKIAN   MRN: [id number]    DOB: 05/09/1956   Date:01/26/2015       Progress Note  Subjective  Chief Complaint  Chief Complaint  Patient presents with  . Follow-up    Discuss VItamin D  . Advice Only    Cyst on hand/leg    HPI Pt. Is here for follow up of Vitamin D deficiency. She had a level drawn in April 2016 which was 8.8. She was then started on Vitamin D 50000 units weekly for 12 weeks. SHe feels well. She is also here for multiple 'cysts' on her body, started 2 days ago and she thinks they popped out of nowhere. Mainly concerned about the cysts in her left knee (both front and back of the knee).  Past Medical History  Diagnosis Date  . Allergy     Past Surgical History  Procedure Laterality Date  . Colonoscopy N/A 11/28/2014    Procedure: COLONOSCOPY;  Surgeon: Lucilla Lame, MD;  Location: [facility];  Service: Gastroenterology;  Laterality: N/A;  . Polypectomy  11/28/2014    Procedure: POLYPECTOMY INTESTINAL;  Surgeon: Lucilla Lame, MD;  Location: [facility];  Service: Gastroenterology;;  . Abdominal hysterectomy  2003  . Back surgery  2005    Family History  Problem Relation Age of Onset  . Cancer Mother   . Multiple sclerosis Mother   . Heart attack Father   . Cancer Sister     lung    History   Social History  . Marital Status: Married    Spouse Name: N/A  . Number of Children: N/A  . Years of Education: N/A   Occupational History  . Not on file.   Social History Main Topics  . Smoking status: Former Smoker    Types: Cigarettes    Quit date: 11/20/2002  . Smokeless tobacco: Former Systems developer    Quit date: 11/20/1983  . Alcohol Use: No  . Drug Use: Not on file  . Sexual Activity: Not on file   Other Topics Concern  . Not on file   Social History Narrative     Current outpatient prescriptions:  .  Ascorbic Acid (VITAMIN C) 100 MG tablet, Take 100 mg by mouth daily., Disp: , Rfl:  .  Biotin 1 MG CAPS, Take by mouth.,  Disp: , Rfl:  .  Vitamin D, Ergocalciferol, (DRISDOL) 50000 UNITS CAPS capsule, Take 1 capsule (50,000 Units total) by mouth every 7 (seven) days., Disp: 12 capsule, Rfl: 0  Allergies  Allergen Reactions  . Cephalexin Itching    Itching  . Hydrocodone-Acetaminophen Nausea Only  . Oxycodone-Acetaminophen Nausea Only  . Sulfa Antibiotics Rash  . Tramadol Rash    sedating     Review of Systems  Constitutional: Negative for fever, chills, weight loss and malaise/fatigue.  Musculoskeletal: Positive for back pain and neck pain. Negative for joint pain.      Objective  Filed Vitals:   01/26/15 1615  BP: 138/82  Pulse: 85  Temp: 98.3 F (36.8 C)  TempSrc: Oral  Resp: 18  Height: 5\' 5"  (1.651 m)  Weight: 241 lb 12.8 oz (109.68 kg)  SpO2: 97%    Physical Exam  Constitutional: She is oriented to person, place, and time and well-developed, well-nourished, and in no distress.  Cardiovascular: Normal rate.   Pulmonary/Chest: Effort normal and breath sounds normal.  Abdominal: Soft. Bowel sounds are normal.  Musculoskeletal:       Right knee: She exhibits normal  range of motion, no swelling, no deformity and no laceration. Tenderness found.       Left forearm: She exhibits tenderness.       Arms: Tenderness over the lateral side of left elbow on palpation, no mass palpated. Similarly, tenderness over the right lateral knee, no palpable mass identifed.  Neurological: She is alert and oriented to person, place, and time.  Nursing note and vitals reviewed.    Assessment & Plan 1. Vitamin D deficiency We will recheck level today. - Vitamin D (25 hydroxy)  2. Pain of left upper arm No palpable abnormality except tenderness on exam.Will obtain an ultrasound for evaluation. - [place] Misc Soft Tissue; Future  3. Right knee pain  - DG Knee Complete 4 Views Right; Future   Mackensey Bolte [facility] 01/26/2015 4:28 PM

## 2015-01-27 ENCOUNTER — Other Ambulatory Visit: Payer: Self-pay | Admitting: Family Medicine

## 2015-01-27 DIAGNOSIS — M79622 Pain in left upper arm: Secondary | ICD-10-CM

## 2015-01-27 LAB — VITAMIN D 25 HYDROXY (VIT D DEFICIENCY, FRACTURES): Vit D, 25-Hydroxy: 32.6 ng/mL (ref 30.0–100.0)

## 2015-02-02 ENCOUNTER — Ambulatory Visit
Admission: RE | Admit: 2015-02-02 | Discharge: 2015-02-02 | Disposition: A | Discharge status: Home / Self Care | Payer: 59 | Source: Ambulatory Visit | Attending: Family Medicine | Admitting: Family Medicine

## 2015-02-02 DIAGNOSIS — M79622 Pain in left upper arm: Secondary | ICD-10-CM | POA: Insufficient documentation

## 2015-02-20 ENCOUNTER — Encounter: Payer: Self-pay | Admitting: Podiatry

## 2015-03-25 DIAGNOSIS — M722 Plantar fascial fibromatosis: Secondary | ICD-10-CM | POA: Diagnosis not present

## 2015-03-26 ENCOUNTER — Telehealth: Payer: Self-pay | Admitting: Family Medicine

## 2015-03-26 NOTE — Telephone Encounter (Signed)
Pt wants to know if her appeal forms for her insurance has been completed and mailed back? Pt has other questions as well and would just like for you to call her.

## 2015-03-27 NOTE — Telephone Encounter (Signed)
Spoke with patient and she has scheduled an appointment to discuss BMI per Dr. Manuella Ghazi

## 2015-03-30 ENCOUNTER — Telehealth: Payer: Self-pay | Admitting: *Deleted

## 2015-03-30 NOTE — Telephone Encounter (Signed)
I'm calling to see how you are doing following your surgery.  "I'm doing okay.  I'm keeping my foot elevated.  My pain medication is doing okay."  I'm having difficulty understand what you are saying due to static.  If you have any questions or concerns feel free to give [place] a call.  You have an appointment scheduled for tomorrow at  11:45am.  "Okay, thank you."

## 2015-03-31 ENCOUNTER — Encounter: Payer: Self-pay | Admitting: Podiatry

## 2015-03-31 ENCOUNTER — Ambulatory Visit (INDEPENDENT_AMBULATORY_CARE_PROVIDER_SITE_OTHER): Payer: 59 | Admitting: Podiatry

## 2015-03-31 VITALS — BP 153/94 | HR 61 | Resp 18

## 2015-03-31 DIAGNOSIS — M722 Plantar fascial fibromatosis: Secondary | ICD-10-CM

## 2015-03-31 DIAGNOSIS — Z9889 Other specified postprocedural states: Secondary | ICD-10-CM

## 2015-03-31 MED ORDER — MEPERIDINE HCL 50 MG PO TABS: 50.0000 mg | ORAL_TABLET | ORAL | Status: DC | PRN

## 2015-03-31 MED ORDER — MELOXICAM 7.5 MG PO TABS: 7.5000 mg | ORAL_TABLET | Freq: Every day | ORAL | Status: DC

## 2015-04-01 NOTE — Progress Notes (Signed)
Patient ID: Diane Case, female   DOB: 12-31-1955, 59 y.o.   MRN: [id number]  DOS: 03/25/15 s/p R plantar fasciotomy  Subjective: Patient presents the office today one-week status post right foot plantar fasciotomy. She states her pain has improved impaired what it was after surgery. She does have some pain to review with pressure DOS the area. She is taking antibiotic as directed. She denies any systemic complaints as fevers, chills, nausea, vomiting. No calf pain, chest pain, shortness of breath. No other complaints at this time in no acute changes.  Objective: AAO 3, NAD Neurovascular status intact and unchanged Incision on the medial and lateral portion of the rear foot as well coapted without any evidence of dehiscence and sutures are intact. There is no surrounding erythema, ascending cellulitis, fluctuance, crepitus, malodor, drainage/purulence. There is mild edema along the surgical site. Has mild tenderness to palpation around the surgical site. No other areas of tenderness to bilateral lower extremities. No open lesions or pre-ulcerative lesions. No pain with calf compression, swelling, warmth, erythema.  Assessment: 1 week status post right plantar fasciotomy with improving pain  Plan: -Treatment options discussed including all alternatives, risks, and complications -Antibiotic ointment was placed over the incision followed by dry sterile dressing. Keep dressing clean, dry, intact. -Continue the cam boot at all times. -Ice and elevation -Pain medication as needed -Monitor for any clinical signs or symptoms of infection or DVT/PE and directed to call the office immediately should any occur or go to the ER. -Follow-up in 1 week for likely suture removal or sooner if any problems arise. In the meantime, encouraged to call the office with any questions, concerns, change in symptoms.   Celesta Gentile, DPM

## 2015-04-02 ENCOUNTER — Ambulatory Visit: Payer: 59 | Admitting: Family Medicine

## 2015-04-06 ENCOUNTER — Ambulatory Visit (INDEPENDENT_AMBULATORY_CARE_PROVIDER_SITE_OTHER): Payer: 59 | Admitting: Family Medicine

## 2015-04-06 ENCOUNTER — Ambulatory Visit: Payer: 59 | Admitting: Family Medicine

## 2015-04-06 ENCOUNTER — Encounter: Payer: Self-pay | Admitting: Family Medicine

## 2015-04-06 ENCOUNTER — Telehealth: Payer: Self-pay | Admitting: Podiatry

## 2015-04-06 VITALS — BP 138/84 | HR 70 | Temp 98.7°F | Resp 16 | Ht 65.0 in | Wt 242.0 lb

## 2015-04-06 DIAGNOSIS — M722 Plantar fascial fibromatosis: Secondary | ICD-10-CM | POA: Diagnosis not present

## 2015-04-06 DIAGNOSIS — G4733 Obstructive sleep apnea (adult) (pediatric): Secondary | ICD-10-CM

## 2015-04-06 DIAGNOSIS — IMO0001 Reserved for inherently not codable concepts without codable children: Secondary | ICD-10-CM

## 2015-04-06 DIAGNOSIS — R030 Elevated blood-pressure reading, without diagnosis of hypertension: Secondary | ICD-10-CM | POA: Diagnosis not present

## 2015-04-06 NOTE — Progress Notes (Signed)
Name: Diane Case   MRN: [id number]    DOB: 05-Feb-1956   Date:04/06/2015       Progress Note  Subjective  Chief Complaint  Chief Complaint  Patient presents with  . Obesity    BMI for [facility]    HPI  Obesity  Patient has a history of obesity for several years.  Attempts at weight loss have included diet and exercise and nothing .  Results of this regimen  have been unsuccessful .  Patient now voices and interest in weight loss by  diet and exercise as brought her attention by employer.  Elevated blood pressure the current problem with any chest pain palpitations orthopnea. Extremity swelling. She has a history of elevated blood pressure on 03/31/2015  of  153/94 .  Obstructive sleep apnea  Patient's been noted in the OR to have apneic episode was severe snoring. This ends been noted at home. She does have some lower extremity swelling and some daytime somnolence  Plantar fasciitis  Patient hasn't had a number of treatments for plantar fasciitis of the right foot including splinting injections and is most recently had a surgery for which she has been walking shoe for a number of months and therefore unable to exercise.  Past Medical History  Diagnosis Date  . Allergy     Social History  Substance Use Topics  . Smoking status: Former Smoker    Types: Cigarettes    Quit date: 11/20/2002  . Smokeless tobacco: Former Systems developer    Quit date: 11/20/1983  . Alcohol Use: No     Current outpatient prescriptions:  .  Ascorbic Acid (VITAMIN C) 100 MG tablet, Take 100 mg by mouth daily., Disp: , Rfl:  .  meperidine (DEMEROL) 50 MG tablet, , Disp: , Rfl:  .  promethazine (PHENERGAN) 25 MG tablet, , Disp: , Rfl:   Allergies  Allergen Reactions  . Cephalexin Itching    Itching  . Hydrocodone-Acetaminophen Nausea Only  . Oxycodone-Acetaminophen Nausea Only  . Sulfa Antibiotics Rash  . Tramadol Rash    sedating    Review of Systems  Constitutional: Negative for fever, chills  and weight loss.       Weight gain  HENT: Negative for congestion, hearing loss, sore throat and tinnitus.   Eyes: Negative for blurred vision, double vision and redness.  Respiratory: Negative for cough, hemoptysis and shortness of breath.        Episodes of severe snoring and apneic episodes have been witnessed both at home and the operating room  Cardiovascular: Negative for chest pain, palpitations, orthopnea, claudication and leg swelling.  Gastrointestinal: Negative for heartburn, nausea, vomiting, diarrhea, constipation and blood in stool.  Genitourinary: Negative for dysuria, urgency, frequency and hematuria.  Musculoskeletal: Positive for joint pain. Negative for myalgias, back pain, falls and neck pain.  Skin: Negative for itching.  Neurological: Negative for dizziness, tingling, tremors, focal weakness, seizures, loss of consciousness, weakness and headaches.  Endo/Heme/Allergies: Negative for environmental allergies and polydipsia. Does not bruise/bleed easily.       No heat or cold intolerance or night sweats  Psychiatric/Behavioral: Negative for depression and substance abuse. The patient is not nervous/anxious and does not have insomnia.      Objective  Filed Vitals:   04/06/15 1053  BP: 138/84  Pulse: 70  Temp: 98.7 F (37.1 C)  TempSrc: Oral  Resp: 16  Height: 5\' 5"  (1.651 m)  Weight: 242 lb (109.77 kg)  SpO2: 96%     Physical  Exam  Constitutional: She is oriented to person, place, and time and well-developed, well-nourished, and in no distress.  Morbidly obese and in no acute distress  HENT:  Head: Normocephalic.  Eyes: EOM are normal. Pupils are equal, round, and reactive to light.  Neck: Normal range of motion. No thyromegaly present.  Cardiovascular: Normal rate, regular rhythm and normal heart sounds.   No murmur heard. Pulmonary/Chest: Effort normal and breath sounds normal.  Abdominal: Soft.  Musculoskeletal: She exhibits edema.  Walking shoe on  her right foot from plantar fasciitis surgery  Neurological: She is alert and oriented to person, place, and time. No cranial nerve deficit. Gait normal.  Skin: Skin is warm and dry. No rash noted.  Psychiatric: Memory and affect normal.      Assessment & Plan  1. Obesity, Class III, BMI 40-49.9 (morbid obesity) ([place])  - Ambulatory referral to Nutrition and Diabetic Education  2. Elevated blood pressure Recheck on return visit on 1 monthly while  she is encouraged to restrict salt  3. Obstructive sleep apnea Sleep study - Nocturnal polysomnography (NPSG); Future - Ambulatory referral to Nutrition and Diabetic Education  4. Plantar fasciitis of right foot Per podiatrist

## 2015-04-06 NOTE — Patient Instructions (Signed)
Obesity Obesity is defined as having too much total body fat and a body mass index (BMI) of 30 or more. BMI is an estimate of body fat and is calculated from your height and weight. BMI is typically calculated by your health care provider during regular wellness visits. Obesity happens when you consume more calories than you can burn by exercising or performing daily physical tasks. Prolonged obesity can cause major illnesses or emergencies, such as:  Stroke.  Heart disease.  Diabetes.  Cancer.  Arthritis.  High blood pressure (hypertension).  High cholesterol.  Sleep apnea.  Erectile dysfunction.  Infertility problems. CAUSES   Regularly eating unhealthy foods.  Physical inactivity.  Certain disorders, such as an underactive thyroid (hypothyroidism), Cushing's syndrome, and polycystic ovarian syndrome.  Certain medicines, such as steroids, some depression medicines, and antipsychotics.  Genetics.  Lack of sleep. DIAGNOSIS A health care provider can diagnose obesity after calculating your BMI. Obesity will be diagnosed if your BMI is 30 or higher. There are other methods of measuring obesity levels. Some other methods include measuring your skinfold thickness, your waist circumference, and comparing your hip circumference to your waist circumference. TREATMENT  A healthy treatment program includes some or all of the following:  Long-term dietary changes.  Exercise and physical activity.  Behavioral and lifestyle changes.  Medicine only under the supervision of your health care provider. Medicines may help, but only if they are used with diet and exercise programs. If your BMI is 40 or higher, your health care provider may recommend specialized surgery or programs to help with weight loss. An unhealthy treatment program includes:  Fasting.  Fad diets.  Supplements and drugs. These choices do not succeed in long-term weight control. HOME CARE  INSTRUCTIONS  Exercise and perform physical activity as directed by your health care provider. To increase physical activity, try the following:  Use stairs instead of elevators.  Park farther away from store entrances.  Garden, bike, or walk instead of watching television or using the computer.  Eat healthy, low-calorie foods and drinks on a regular basis. Eat more fruits and vegetables. Use low-calorie cookbooks or take healthy cooking classes.  Limit fast food, sweets, and processed snack foods.  Eat smaller portions.  Keep a daily journal of everything you eat. There are many free websites to help you with this. It may be helpful to measure your foods so you can determine if you are eating the correct portion sizes.  Avoid drinking alcohol. Drink more water and drinks without calories.  Take vitamins and supplements only as recommended by your health care provider.  Weight-loss support groups, registered dietitians, counselors, and stress reduction education can also be very helpful. SEEK IMMEDIATE MEDICAL CARE IF:  You have chest pain or tightness.  You have trouble breathing or feel short of breath.  You have weakness or leg numbness.  You feel confused or have trouble talking.  You have sudden changes in your vision.   This information is not intended to replace advice given to you by your health care provider. Make sure you discuss any questions you have with your health care provider.   Document Released: 07/21/2004 Document Revised: 07/04/2014 Document Reviewed: 07/20/2011 Elsevier Interactive Patient Education 2016 [employer] Inc.  

## 2015-04-06 NOTE — Telephone Encounter (Signed)
Pt called wanting too know about the knee scooter for $50.00 that lisa was telling her about

## 2015-04-07 ENCOUNTER — Other Ambulatory Visit: Payer: Self-pay | Admitting: Podiatry

## 2015-04-07 ENCOUNTER — Encounter: Payer: Self-pay | Admitting: Podiatry

## 2015-04-07 ENCOUNTER — Ambulatory Visit (INDEPENDENT_AMBULATORY_CARE_PROVIDER_SITE_OTHER): Payer: 59 | Admitting: Podiatry

## 2015-04-07 VITALS — BP 156/64 | HR 64 | Resp 18

## 2015-04-07 DIAGNOSIS — Z9889 Other specified postprocedural states: Secondary | ICD-10-CM

## 2015-04-07 DIAGNOSIS — M722 Plantar fascial fibromatosis: Secondary | ICD-10-CM

## 2015-04-07 NOTE — Patient Instructions (Signed)

## 2015-04-07 NOTE — Progress Notes (Signed)
Patient ID: Diane Case, female   DOB: 28-Jan-1956, 59 y.o.   MRN: [id number]  DOS: 03/25/15 s/p R plantar fasciotomy  Subjective: Patient presents the office today 2-weeks status post right foot plantar fasciotomy. She presents today for suture removal. She states that she continues to have pain along the surgical site although does occur to be improving compared to last appointment. She does continue to ambulate in a CAM boot with a cane. She takes the pain medication intermittently but not on a consistent basis. She denies any systemic complaints such as fevers, chills, nausea, vomiting. No calf pain, chest pain, shortness of breath.  Objective: AAO 3, NAD Neurovascular status intact and unchanged Incision on the medial and lateral portion of the rear foot as well coapted without any evidence of dehiscence and sutures are intact. There is no surrounding erythema, ascending cellulitis, fluctuance, crepitus, malodor, drainage/purulence. There is mild continued edema along the surgical site although it does appear to be somewhat improved compared to last appointment. There is also continuation of mild tenderness to palpation around the surgical site. No other areas of tenderness to bilateral lower extremities. No open lesions or pre-ulcerative lesions. No pain with calf compression, swelling, warmth, erythema.  Assessment: 2 weeks status post right plantar fasciotomy, improving.   Plan: -Treatment options discussed including all alternatives, risks, and complications -Sutures removed without complications. Antibiotic ointment was placed over the incision followed by dry sterile dressing. Keep dressing clean, dry, intact. She can start to shower tomorrow 5 the incision remains healed. If there are any problems to hold off on showering and call the office immediately. -Continue the cam boot at all times. She is asleep of the CAM boot with a night splint. -Ice and elevation -Pain medication as  needed -Stretching exercises daily. -Prescribed a rolling knee scooter to help take pressure off he heel.  -Monitor for any clinical signs or symptoms of infection or DVT/PE and directed to call the office immediately should any occur or go to the ER. -Follow-up in 2 weeks or sooner if any problems arise. In the meantime, encouraged to call the office with any questions, concerns, change in symptoms.   Celesta Gentile, DPM

## 2015-04-08 ENCOUNTER — Encounter: Payer: Self-pay | Admitting: *Deleted

## 2015-04-08 ENCOUNTER — Telehealth: Payer: Self-pay | Admitting: *Deleted

## 2015-04-08 NOTE — Telephone Encounter (Addendum)
Pt states yesterday Dr. Jacqualyn Posey released her to return to work, and she needs a note.  Pt called asked if note had be faxed to [phone].  I told pt I had put in a communication to Dr. Jacqualyn Posey to clarify the return to work, there was not indication in his chart notes, and I would fax as soon as I received an answer. Faxed return to work note.  Faxed to [facility] [phone], as a precaution to my fax machine not working properly 04/08/2015.

## 2015-04-08 NOTE — Telephone Encounter (Signed)
PATIENT WAS IN FOR AN APPT TO SEE DR Jacqualyn Posey YESTERDAY AND HE ORDERED A SCOOTER Gratis. Diane Case

## 2015-04-08 NOTE — Telephone Encounter (Signed)
She can return to work if she feels ready. Keep iced/elevated. Sit as much as possible.

## 2015-04-15 ENCOUNTER — Telehealth: Payer: Self-pay | Admitting: *Deleted

## 2015-04-15 NOTE — Telephone Encounter (Signed)
Called patient and left a message to call me back to let me know when she can go back to work, I am filling out the disability forms. Lattie Haw

## 2015-04-23 ENCOUNTER — Encounter: Payer: Self-pay | Admitting: Podiatry

## 2015-04-23 ENCOUNTER — Ambulatory Visit (INDEPENDENT_AMBULATORY_CARE_PROVIDER_SITE_OTHER): Payer: 59 | Admitting: Podiatry

## 2015-04-23 VITALS — BP 147/88 | HR 60 | Resp 12

## 2015-04-23 DIAGNOSIS — Z9889 Other specified postprocedural states: Secondary | ICD-10-CM

## 2015-04-23 DIAGNOSIS — M722 Plantar fascial fibromatosis: Secondary | ICD-10-CM

## 2015-04-23 MED ORDER — DICLOFENAC SODIUM 1 % TD GEL: 2.0000 g | Freq: Four times a day (QID) | TRANSDERMAL | Status: DC

## 2015-04-23 NOTE — Progress Notes (Signed)
Patient ID: Diane Case, female   DOB: 04-Mar-1956, 59 y.o.   MRN: [id number]  DOS: 03/25/15 s/p R plantar fasciotomy  Subjective: Patient presents the office today status post right foot plantar fasciotomy.  She states that she feels that she is making some improvement compared to last appointment. She has tried transition to regular shoe while at home although the day she will have pain on her feet. She continues with a cam boot. She denies any numbness or tingling. She states that her swelling has decreased since last appointment on the surgical site. She denies any systemic complaints such as fevers, chills, nausea, vomiting. No calf pain, chest pain, shortness of breath.  Objective: AAO 3, NAD Neurovascular status intact and unchanged Incision on the medial and lateral portion of the rear foot as well coapted without any evidence of dehiscence and a scar has formed. There is continuation of tenderness in the plantar aspect of the calcaneus or the insertion of plantar fascia. The pain does appear to be somewhat decreased compared to last upon it. There is decreased edema along the surgical site. There is no overlying erythema or increase in warmth. There is  Continuation of the equinus. There does appear to be discomfort upon dorsiflexion of the ankle and there is quite a bit of tightness in the Achilles tendon. There is no pain on the course the Achilles tendon. No other areas of tenderness to bilateral lower extremities. No other open lesions or pre-ulcer lesions. No pain with calf compression, swelling, warmth, erythema.  Assessment: Status post right plantar fasciotomy, improving.   Plan: -Treatment options discussed including all alternatives, risks, and complications - prescribed Voltaren gel. Also recommended continue with anti-inflammatories. She can continue the candidate. Transition to regular shoe as tolerated. Continue with aggressive stretching and rehabilitation exercises. I  discussed with her physical therapy however she wishes to hold off on that. I discussed with her given her significant equinus if this is not get stretched out she'll likely continue to have heel pain.  We discussed that I'm concerned that the plantar fascial get scarred down in a contracted position up he not get this stretched out. -Follow-up in 3 weeks or sooner if any problems arise. In the meantime, encouraged to call the office with any questions, concerns, change in symptoms.   Celesta Gentile, DPM

## 2015-04-24 ENCOUNTER — Telehealth: Payer: Self-pay | Admitting: *Deleted

## 2015-04-24 NOTE — Telephone Encounter (Signed)
Pt request to know when Dr. Jacqualyn Posey wanted to see her again.  Left message [phone] informing pt her next appt was 05/14/2015 at 345pm.

## 2015-04-26 ENCOUNTER — Other Ambulatory Visit: Payer: Self-pay | Admitting: Podiatry

## 2015-04-27 ENCOUNTER — Telehealth: Payer: Self-pay | Admitting: *Deleted

## 2015-04-27 MED ORDER — MELOXICAM 7.5 MG PO TABS: 7.5000 mg | ORAL_TABLET | Freq: Every day | ORAL | Status: DC

## 2015-04-27 NOTE — Telephone Encounter (Signed)
If she feels like she can work, I am OK with it.

## 2015-04-27 NOTE — Addendum Note (Signed)
Addended by: Harriett Sine D on: 04/27/2015 09:31 AM   Modules accepted: Orders

## 2015-04-27 NOTE — Telephone Encounter (Addendum)
Pt asked if she can return to work on 05/06/2015.  Dr Jacqualyn Posey states okay.  Left message informing pt of Dr. Leigh Aurora statement and to call with her employers information and requirements.  Pt states she would like to go back to work 05/06/2015 without restrictions to Diane Case, and [facility].  I informed pt I would type the letters to Whom it may concern and she can give one copy to each personnel dept.  Pt agreed.  Faxed to pt at [place].

## 2015-04-29 ENCOUNTER — Encounter: Payer: Self-pay | Admitting: *Deleted

## 2015-05-04 ENCOUNTER — Ambulatory Visit: Payer: 59 | Admitting: Family Medicine

## 2015-05-13 ENCOUNTER — Ambulatory Visit: Payer: 59 | Admitting: Dietician

## 2015-05-14 ENCOUNTER — Encounter: Payer: 59 | Admitting: Podiatry

## 2015-05-18 ENCOUNTER — Ambulatory Visit (INDEPENDENT_AMBULATORY_CARE_PROVIDER_SITE_OTHER): Payer: 59 | Admitting: Family Medicine

## 2015-05-18 ENCOUNTER — Ambulatory Visit
Admission: RE | Admit: 2015-05-18 | Discharge: 2015-05-18 | Disposition: A | Discharge status: Home / Self Care | Payer: 59 | Source: Ambulatory Visit | Attending: Family Medicine | Admitting: Family Medicine

## 2015-05-18 ENCOUNTER — Encounter: Payer: Self-pay | Admitting: Family Medicine

## 2015-05-18 VITALS — BP 138/80 | HR 87 | Temp 98.2°F | Resp 17 | Ht 65.0 in | Wt 246.6 lb

## 2015-05-18 DIAGNOSIS — G4730 Sleep apnea, unspecified: Secondary | ICD-10-CM | POA: Insufficient documentation

## 2015-05-18 DIAGNOSIS — M47892 Other spondylosis, cervical region: Secondary | ICD-10-CM | POA: Diagnosis not present

## 2015-05-18 DIAGNOSIS — M792 Neuralgia and neuritis, unspecified: Secondary | ICD-10-CM | POA: Diagnosis not present

## 2015-05-18 MED ORDER — HYDROCODONE-ACETAMINOPHEN 5-300 MG PO TABS: 1.0000 | ORAL_TABLET | Freq: Three times a day (TID) | ORAL | Status: DC | PRN

## 2015-05-18 NOTE — Progress Notes (Signed)
Name: Diane Case   MRN: [id number]    DOB: 02/05/1956   Date:05/18/2015       Progress Note  Subjective  Chief Complaint  Chief Complaint  Patient presents with  . Follow-up    3 mo  . Referral    sleep lab    Arm Pain  There was no injury mechanism. The pain is present in the right forearm, right hand and right shoulder. The quality of the pain is described as shooting. The pain radiates to the right arm, right hand and right neck (pain starts at the right upper arm and radiates down to her right hand and finger. It also radiates up to her R. shoulder and neck). The pain is at a severity of 5/10 (sometimes its more than 10/10). The pain is severe. Pertinent negatives include no chest pain, muscle weakness, numbness or tingling. Nothing aggravates the symptoms. She has tried NSAIDs (Aleve) for the symptoms. The treatment provided no relief.   Sleep Study Referral Pt. Is requesting a referral for sleep study. She has history of Sleep Apnea in the past and was prescribed CPAP but never purchased the machine as it was too expensive. She was told that my 'breath kept cutting off' during surgery for her foot in September 2016. She has shortness of breath when she wakes up and feels very tired during the day. She has been recommended to obtain a repeat sleep study for restarting on CPAP therapy  Past Medical History  Diagnosis Date  . Allergy     Past Surgical History  Procedure Laterality Date  . Colonoscopy N/A 11/28/2014    Procedure: COLONOSCOPY;  Surgeon: Lucilla Lame, MD;  Location: [facility];  Service: Gastroenterology;  Laterality: N/A;  . Polypectomy  11/28/2014    Procedure: POLYPECTOMY INTESTINAL;  Surgeon: Lucilla Lame, MD;  Location: [facility];  Service: Gastroenterology;;  . Abdominal hysterectomy  2003  . Back surgery  2005  . Foot surgery Right     Family History  Problem Relation Age of Onset  . Cancer Mother   . Multiple sclerosis Mother   .  Heart attack Father   . Cancer Sister     lung    Social History   Social History  . Marital Status: Married    Spouse Name: N/A  . Number of Children: N/A  . Years of Education: N/A   Occupational History  . Not on file.   Social History Main Topics  . Smoking status: Former Smoker    Types: Cigarettes    Quit date: 11/20/2002  . Smokeless tobacco: Former Systems developer    Quit date: 11/20/1983  . Alcohol Use: No  . Drug Use: No  . Sexual Activity: Not on file   Other Topics Concern  . Not on file   Social History Narrative     Current outpatient prescriptions:  .  Ascorbic Acid (VITAMIN C) 100 MG tablet, Take 100 mg by mouth daily., Disp: , Rfl:  .  diclofenac sodium (VOLTAREN) 1 % GEL, Apply 2 g topically 4 (four) times daily. Rub into affected area of foot 2 to 4 times daily, Disp: 100 g, Rfl: 2 .  meloxicam (MOBIC) 7.5 MG tablet, Take 1 tablet (7.5 mg total) by mouth daily., Disp: 30 tablet, Rfl: 0 .  meperidine (DEMEROL) 50 MG tablet, , Disp: , Rfl:  .  promethazine (PHENERGAN) 25 MG tablet, , Disp: , Rfl:   Allergies  Allergen Reactions  . Cephalexin Itching  Itching  . Hydrocodone-Acetaminophen Nausea Only  . Oxycodone-Acetaminophen Nausea Only  . Sulfa Antibiotics Rash  . Tramadol Rash    sedating     Review of Systems  Constitutional: Positive for malaise/fatigue. Negative for fever, chills and weight loss.  Respiratory: Positive for cough and shortness of breath.   Cardiovascular: Negative for chest pain.  Musculoskeletal: Positive for myalgias, joint pain and neck pain.  Neurological: Negative for tingling, weakness and numbness.    Objective  Filed Vitals:   05/18/15 1002  BP: 138/80  Pulse: 87  Temp: 98.2 F (36.8 C)  TempSrc: Oral  Resp: 17  Height: 5\' 5"  (1.651 m)  Weight: 246 lb 9.6 oz (111.857 kg)  SpO2: 98%    Physical Exam  Constitutional: She is oriented to person, place, and time and well-developed, well-nourished, and in no  distress.  Cardiovascular: Normal rate, regular rhythm and normal heart sounds.   No murmur heard. Pulmonary/Chest: Effort normal and breath sounds normal. She has no wheezes.  Musculoskeletal:       Right shoulder: She exhibits decreased range of motion, tenderness and pain. She exhibits no swelling.       Cervical back: She exhibits no tenderness, no pain and no spasm.       Arms: Tenderness to palaption over the right shoulder blade, anterior shoulder, right proximal arm traveling down to her right hand. RIght upper arm is tender to palpation, no erythema  Neurological: She is alert and oriented to person, place, and time.  Nursing note and vitals reviewed.   Assessment & Plan  1. Radicular pain in right arm Obtain x-ray of cervical spine, shoulder, and right arm for evaluation. Suspect osteoarthritis versus muscle spasm. Start on Vicodin 5-300 milligrams 3 times a day as needed for acute and severe pain. - DG Shoulder Right; Future - DG Humerus Right; Future - DG Forearm Right; Future - Hydrocodone-Acetaminophen (VICODIN) 5-300 MG TABS; Take 1 tablet by mouth every 8 (eight) hours as needed.  Dispense: 30 each; Refill: 0 - DG Cervical Spine Complete; Future  2. Sleep apnea Referral to sleep medicine for assessment and sleep study. - Ambulatory referral to Sleep Studies   Orma Cheetham [facility] 05/18/2015 10:42 AM

## 2015-05-19 ENCOUNTER — Encounter: Payer: Self-pay | Admitting: Podiatry

## 2015-05-19 ENCOUNTER — Ambulatory Visit (INDEPENDENT_AMBULATORY_CARE_PROVIDER_SITE_OTHER): Payer: 59

## 2015-05-19 ENCOUNTER — Other Ambulatory Visit: Payer: Self-pay | Admitting: Family Medicine

## 2015-05-19 ENCOUNTER — Ambulatory Visit: Payer: 59 | Admitting: Dietician

## 2015-05-19 ENCOUNTER — Ambulatory Visit (INDEPENDENT_AMBULATORY_CARE_PROVIDER_SITE_OTHER): Payer: 59 | Admitting: Podiatry

## 2015-05-19 VITALS — BP 157/94 | HR 75 | Resp 18

## 2015-05-19 DIAGNOSIS — R52 Pain, unspecified: Secondary | ICD-10-CM

## 2015-05-19 DIAGNOSIS — M62838 Other muscle spasm: Secondary | ICD-10-CM | POA: Insufficient documentation

## 2015-05-19 DIAGNOSIS — Z9889 Other specified postprocedural states: Secondary | ICD-10-CM

## 2015-05-19 DIAGNOSIS — M722 Plantar fascial fibromatosis: Secondary | ICD-10-CM

## 2015-05-19 MED ORDER — TIZANIDINE HCL 4 MG PO CAPS: 4.0000 mg | ORAL_CAPSULE | Freq: Every evening | ORAL | Status: DC | PRN

## 2015-05-19 NOTE — Patient Instructions (Signed)

## 2015-05-25 NOTE — Progress Notes (Signed)
Patient ID: Diane Case, female   DOB: 1956-05-10, 59 y.o.   MRN: [id number]  Subjective: Diane Case is a 59 y.o. is seen today in office s/p right plantar fasciotomy preformed on 03/25/15. They state their pain is improving. She states that the pain she had across the bottom of here heel that she was having prior to surgery is gone and her pain in the morning is improved. She has returned to regular shoe gear. On Saturday she does feel some pulling to the bottom of her foot and slight amount of discomfort although that did resolve. No recent injury or trauma.  Denies any systemic complaints such as fevers, chills, nausea, vomiting. No calf pain, chest pain, shortness of breath.   Objective: General: No acute distress, AAOx3  DP/PT pulses palpable 2/4, CRT < 3 sec to all digits.  Protective sensation intact. Motor function intact.  Right foot: Incision is well coapted without any evidence of dehiscence and a scar has formed. There is no surrounding erythema, ascending cellulitis, fluctuance, crepitus, malodor, drainage/purulence. There is mild but decreased edema around the surgical site. There is improving pain along the surgical site.  No other areas of tenderness to bilateral lower extremities.  No other open lesions or pre-ulcerative lesions.  No pain with calf compression, swelling, warmth, erythema.   Assessment and Plan:  Status post right foot surgery, doing well with no complications and improving pain.   -Treatment options discussed including all alternatives, risks, and complications -Continue with night splint at night. Continue with regular shoe gear as tolerated. Recommended to continue with orthotics. Discussed physical therapy however she declined. -Ice/elevation -Pain medication as needed. -Monitor for any clinical signs or symptoms of infection and DVT/PE and directed to call the office immediately should any occur or go to the ER. -Follow-up as scheduled or sooner if  any problems arise. In the meantime, encouraged to call the office with any questions, concerns, change in symptoms.   Celesta Gentile, DPM

## 2015-06-18 ENCOUNTER — Encounter: Payer: 59 | Admitting: Podiatry

## 2015-06-23 ENCOUNTER — Ambulatory Visit: Payer: 59 | Admitting: Family Medicine

## 2015-06-25 ENCOUNTER — Encounter: Payer: Self-pay | Admitting: Podiatry

## 2015-06-25 ENCOUNTER — Ambulatory Visit (INDEPENDENT_AMBULATORY_CARE_PROVIDER_SITE_OTHER): Payer: 59 | Admitting: Podiatry

## 2015-06-25 VITALS — BP 169/85 | HR 68 | Resp 18

## 2015-06-25 DIAGNOSIS — M722 Plantar fascial fibromatosis: Secondary | ICD-10-CM

## 2015-06-29 DIAGNOSIS — M722 Plantar fascial fibromatosis: Secondary | ICD-10-CM | POA: Insufficient documentation

## 2015-06-29 NOTE — Progress Notes (Signed)
Patient ID: Diane Case, female   DOB: 30-Oct-1955, 60 y.o.   MRN: [id number]  Subjective: Diane Case is a 60 y.o. is seen today in office s/p right plantar fasciotomy preformed on 03/25/15. She states that since last appointment her pain is completely resolved and she is not having any pain. She is able to wear regular shoe without any difficulty and perform daily activities and any problems. She denies any numbness or tingling. Denies any systemic complaints such as fevers, chills, nausea, vomiting. No calf pain, chest pain, shortness of breath.   Objective: General: No acute distress, AAOx3  DP/PT pulses palpable 2/4, CRT < 3 sec to all digits.  Protective sensation intact. Motor function intact.  Right foot: Incision is well coapted without any evidence of dehiscence and a scar has formed. There is no surrounding erythema, ascending cellulitis, fluctuance, crepitus, malodor, drainage/purulence. There is no edema around the surgical site. There is no pain along the surgical site.  No other areas of tenderness to bilateral lower extremities.  No other open lesions or pre-ulcerative lesions.  No pain with calf compression, swelling, warmth, erythema.   Assessment and Plan:  Status post right foot surgery, doing well with no complications and resolved pain.   -Treatment options discussed including all alternatives, risks, and complications - Continue with regular shoe gear as tolerated. Recommended to continue with orthotics.  -Ice/elevation -Pain medication as needed. -Monitor for any clinical signs or symptoms of infection and DVT/PE and directed to call the office immediately should any occur or go to the ER. -Follow-up if symptoms recur or sooner if any problems arise. In the meantime, encouraged to call the office with any questions, concerns, change in symptoms.   Celesta Gentile, DPM

## 2015-07-13 ENCOUNTER — Other Ambulatory Visit: Payer: Self-pay | Admitting: Family Medicine

## 2015-07-13 DIAGNOSIS — M62838 Other muscle spasm: Secondary | ICD-10-CM

## 2015-07-13 NOTE — Telephone Encounter (Signed)
Routed to Dr. Shah for approval 

## 2015-07-13 NOTE — Telephone Encounter (Signed)
Patient is having shoulder pain and is requesting a refill on tizanidine. Please send to [facility] [address]. Have appointment with you next week.

## 2015-07-16 ENCOUNTER — Telehealth: Payer: Self-pay | Admitting: Family Medicine

## 2015-07-16 NOTE — Telephone Encounter (Signed)
Pt called stating she received a call from Dr Manuella Ghazi on Tuesday. Please return her call.

## 2015-07-21 ENCOUNTER — Encounter: Payer: Self-pay | Admitting: Family Medicine

## 2015-07-21 ENCOUNTER — Ambulatory Visit
Admission: RE | Admit: 2015-07-21 | Discharge: 2015-07-21 | Disposition: A | Discharge status: Home / Self Care | Payer: 59 | Source: Ambulatory Visit | Attending: Family Medicine | Admitting: Family Medicine

## 2015-07-21 ENCOUNTER — Ambulatory Visit (INDEPENDENT_AMBULATORY_CARE_PROVIDER_SITE_OTHER): Payer: 59 | Admitting: Family Medicine

## 2015-07-21 DIAGNOSIS — M25542 Pain in joints of left hand: Secondary | ICD-10-CM | POA: Insufficient documentation

## 2015-07-21 DIAGNOSIS — M25541 Pain in joints of right hand: Secondary | ICD-10-CM | POA: Diagnosis not present

## 2015-07-21 DIAGNOSIS — M6249 Contracture of muscle, multiple sites: Secondary | ICD-10-CM

## 2015-07-21 DIAGNOSIS — M25511 Pain in right shoulder: Secondary | ICD-10-CM | POA: Diagnosis not present

## 2015-07-21 DIAGNOSIS — M62838 Other muscle spasm: Secondary | ICD-10-CM

## 2015-07-21 DIAGNOSIS — M7989 Other specified soft tissue disorders: Secondary | ICD-10-CM | POA: Diagnosis not present

## 2015-07-21 MED ORDER — TIZANIDINE HCL 4 MG PO CAPS: 4.0000 mg | ORAL_CAPSULE | Freq: Every evening | ORAL | Status: DC | PRN

## 2015-07-21 MED ORDER — OXYCODONE HCL 5 MG PO CAPS: 5.0000 mg | ORAL_CAPSULE | Freq: Three times a day (TID) | ORAL | Status: DC | PRN

## 2015-07-21 NOTE — Telephone Encounter (Signed)
Patient was seen in office today.  

## 2015-07-21 NOTE — Progress Notes (Signed)
Name: Diane Case   MRN: [id number]    DOB: 10-30-1955   Date:07/21/2015       Progress Note  Subjective  Chief Complaint  Chief Complaint  Patient presents with  . Follow-up    pain shoulder and arm     Hand Pain  There was no injury mechanism. The pain is present in the left hand and right hand. Quality: throbbing pain, worse with trying to make a fist, associated with swelling of knuckles. The pain is at a severity of 2/10. The pain is moderate. She has tried nothing for the symptoms.    Past Medical History  Diagnosis Date  . Allergy     Past Surgical History  Procedure Laterality Date  . Colonoscopy N/A 11/28/2014    Procedure: COLONOSCOPY;  Surgeon: Lucilla Lame, MD;  Location: [facility];  Service: Gastroenterology;  Laterality: N/A;  . Polypectomy  11/28/2014    Procedure: POLYPECTOMY INTESTINAL;  Surgeon: Lucilla Lame, MD;  Location: [facility];  Service: Gastroenterology;;  . Abdominal hysterectomy  2003  . Back surgery  2005  . Foot surgery Right     Family History  Problem Relation Age of Onset  . Cancer Mother   . Multiple sclerosis Mother   . Heart attack Father   . Cancer Sister     lung    Social History   Social History  . Marital Status: Married    Spouse Name: N/A  . Number of Children: N/A  . Years of Education: N/A   Occupational History  . Not on file.   Social History Main Topics  . Smoking status: Former Smoker    Types: Cigarettes    Quit date: 11/20/2002  . Smokeless tobacco: Former Systems developer    Quit date: 11/20/1983  . Alcohol Use: No  . Drug Use: No  . Sexual Activity: Not on file   Other Topics Concern  . Not on file   Social History Narrative     Current outpatient prescriptions:  .  Ascorbic Acid (VITAMIN C) 100 MG tablet, Take 100 mg by mouth daily., Disp: , Rfl:  .  diclofenac sodium (VOLTAREN) 1 % GEL, Apply 2 g topically 4 (four) times daily. Rub into affected area of foot 2 to 4 times daily, Disp: 100  g, Rfl: 2 .  Hydrocodone-Acetaminophen (VICODIN) 5-300 MG TABS, Take 1 tablet by mouth every 8 (eight) hours as needed., Disp: 30 each, Rfl: 0 .  meloxicam (MOBIC) 7.5 MG tablet, Take 1 tablet (7.5 mg total) by mouth daily., Disp: 30 tablet, Rfl: 0 .  meperidine (DEMEROL) 50 MG tablet, , Disp: , Rfl:  .  promethazine (PHENERGAN) 25 MG tablet, , Disp: , Rfl:  .  tiZANidine (ZANAFLEX) 4 MG capsule, Take 1 capsule (4 mg total) by mouth at bedtime as needed for muscle spasms., Disp: 15 capsule, Rfl: 0  Allergies  Allergen Reactions  . Cephalexin Itching    Itching  . Hydrocodone-Acetaminophen Nausea Only  . Oxycodone-Acetaminophen Nausea Only  . Sulfa Antibiotics Rash  . Tramadol Rash    sedating     Review of Systems  Musculoskeletal: Positive for myalgias, joint pain and neck pain.     Objective  Filed Vitals:   07/21/15 1555  BP: 141/74  Pulse: 85  Temp: 98.2 F (36.8 C)  TempSrc: Oral  Resp: 19  Height: 5\' 5"  (1.651 m)  Weight: 247 lb 3.2 oz (112.129 kg)  SpO2: 96%    Physical Exam  Constitutional: She is well-developed, well-nourished, and in no distress.  Cardiovascular: Normal rate and regular rhythm.   Pulmonary/Chest: Effort normal and breath sounds normal.  Musculoskeletal:       Right hand: She exhibits tenderness and bony tenderness.       Left hand: She exhibits tenderness and bony tenderness.       Hands: Tenderness to palpation over the 1st MCP joint of right hand, tenderness to palaption over the first MCP joint of left hand   Nursing note and vitals reviewed.       Assessment & Plan  1. Joint pain in fingers of right hand  - DG Hand Complete Right; Future - DG Hand Complete Left; Future  2. Pain in right shoulder Patient reports severe pain, not responding to hydrocodone. We will start on oxycodone 5 mg 3 times daily for 10 days. Obtain MRI of right shoulder. - MR Shoulder Right Wo Contrast; Future - tiZANidine (ZANAFLEX) 4 MG capsule;  Take 1 capsule (4 mg total) by mouth at bedtime as needed for muscle spasms.  Dispense: 30 capsule; Refill: 0 - oxycodone (OXY-IR) 5 MG capsule; Take 1 capsule (5 mg total) by mouth every 8 (eight) hours as needed.  Dispense: 30 capsule; Refill: 0  3. Cervical paraspinal muscle spasm    Jaray Boliver [facility] 07/21/2015 4:04 PM

## 2015-08-17 ENCOUNTER — Other Ambulatory Visit: Payer: Self-pay

## 2015-08-17 DIAGNOSIS — M25511 Pain in right shoulder: Secondary | ICD-10-CM

## 2015-08-17 MED ORDER — TIZANIDINE HCL 4 MG PO CAPS: 4.0000 mg | ORAL_CAPSULE | Freq: Every evening | ORAL | Status: DC | PRN

## 2015-08-17 NOTE — Telephone Encounter (Signed)
Routed to Dr. Shah for approval 

## 2015-08-24 ENCOUNTER — Encounter: Payer: Self-pay | Admitting: Family Medicine

## 2015-08-24 ENCOUNTER — Ambulatory Visit (INDEPENDENT_AMBULATORY_CARE_PROVIDER_SITE_OTHER): Payer: 59 | Admitting: Family Medicine

## 2015-08-24 VITALS — BP 141/83 | HR 76 | Temp 98.6°F | Resp 17 | Ht 65.0 in | Wt 247.6 lb

## 2015-08-24 DIAGNOSIS — M792 Neuralgia and neuritis, unspecified: Secondary | ICD-10-CM

## 2015-08-24 DIAGNOSIS — IMO0001 Reserved for inherently not codable concepts without codable children: Secondary | ICD-10-CM

## 2015-08-24 DIAGNOSIS — R030 Elevated blood-pressure reading, without diagnosis of hypertension: Secondary | ICD-10-CM | POA: Diagnosis not present

## 2015-08-24 MED ORDER — METAXALONE 800 MG PO TABS: 800.0000 mg | ORAL_TABLET | Freq: Three times a day (TID) | ORAL | Status: DC

## 2015-08-24 NOTE — Progress Notes (Signed)
Name: Diane Case   MRN: [id number]    DOB: 03-24-1956   Date:08/24/2015       Progress Note  Subjective  Chief Complaint  Chief Complaint  Patient presents with  . Follow-up    1 mo    Shoulder Pain  The pain is present in the right shoulder and right arm. This is a chronic problem. The problem has been unchanged. The quality of the pain is described as sharp. The pain is at a severity of 10/10 (severe pain). Associated symptoms include stiffness. Pertinent negatives include no fever or joint swelling. She has tried oral narcotics, NSAIDS and rest for the symptoms. The treatment provided moderate (Muscle relaxant has helped relieve the pain to some extent.) relief. Her past medical history is significant for osteoarthritis.    Past Medical History  Diagnosis Date  . Allergy     Past Surgical History  Procedure Laterality Date  . Colonoscopy N/A 11/28/2014    Procedure: COLONOSCOPY;  Surgeon: Lucilla Lame, MD;  Location: [facility];  Service: Gastroenterology;  Laterality: N/A;  . Polypectomy  11/28/2014    Procedure: POLYPECTOMY INTESTINAL;  Surgeon: Lucilla Lame, MD;  Location: [facility];  Service: Gastroenterology;;  . Abdominal hysterectomy  2003  . Back surgery  2005  . Foot surgery Right     Family History  Problem Relation Age of Onset  . Cancer Mother   . Multiple sclerosis Mother   . Heart attack Father   . Cancer Sister     lung    Social History   Social History  . Marital Status: Married    Spouse Name: N/A  . Number of Children: N/A  . Years of Education: N/A   Occupational History  . Not on file.   Social History Main Topics  . Smoking status: Former Smoker    Types: Cigarettes    Quit date: 11/20/2002  . Smokeless tobacco: Former Systems developer    Quit date: 11/20/1983  . Alcohol Use: No  . Drug Use: No  . Sexual Activity: Not on file   Other Topics Concern  . Not on file   Social History Narrative     Current outpatient  prescriptions:  .  Ascorbic Acid (VITAMIN C) 100 MG tablet, Take 100 mg by mouth daily., Disp: , Rfl:  .  tiZANidine (ZANAFLEX) 4 MG capsule, Take 1 capsule (4 mg total) by mouth at bedtime as needed for muscle spasms., Disp: 30 capsule, Rfl: 0  Allergies  Allergen Reactions  . Cephalexin Itching    Itching  . Hydrocodone-Acetaminophen Nausea Only  . Oxycodone-Acetaminophen Nausea Only  . Sulfa Antibiotics Rash  . Tramadol Rash    sedating     Review of Systems  Constitutional: Negative for fever.  Musculoskeletal: Positive for stiffness.    Objective  Filed Vitals:   08/24/15 1601  BP: 141/83  Pulse: 76  Temp: 98.6 F (37 C)  TempSrc: Oral  Resp: 17  Height: 5\' 5"  (1.651 m)  Weight: 247 lb 9.6 oz (112.311 kg)  SpO2: 98%    Physical Exam  Constitutional: She is well-developed, well-nourished, and in no distress.  Musculoskeletal:       Right shoulder: She exhibits decreased range of motion, tenderness, pain and spasm. She exhibits no swelling.       Arms: Tenderness to palpation in the right shoulder, pain with raising of right arm.  Nursing note and vitals reviewed.      Assessment & Plan  1. 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 Patient has not been able to obtain the MRI of her right shoulder until now. We will replace tizanidine with Skelaxin, and referred to orthopedics for further eval. - metaxalone (SKELAXIN) 800 MG tablet; Take 1 tablet (800 mg total) by mouth 3 (three) times daily.  Dispense: 30 tablet; Refill: 0 - Ambulatory referral to Orthopedic Surgery  2. Elevated BP Likely secondary to pain, recheck at next visit.   [name] [facility] 08/24/2015 4:48 PM

## 2015-10-23 ENCOUNTER — Ambulatory Visit
Admission: RE | Admit: 2015-10-23 | Discharge: 2015-10-23 | Disposition: A | Discharge status: Home / Self Care | Payer: 59 | Source: Ambulatory Visit | Attending: Family Medicine | Admitting: Family Medicine

## 2015-10-23 DIAGNOSIS — M25511 Pain in right shoulder: Secondary | ICD-10-CM | POA: Diagnosis not present

## 2015-10-23 DIAGNOSIS — M7551 Bursitis of right shoulder: Secondary | ICD-10-CM | POA: Insufficient documentation

## 2015-10-23 DIAGNOSIS — M75111 Incomplete rotator cuff tear or rupture of right shoulder, not specified as traumatic: Secondary | ICD-10-CM | POA: Diagnosis not present

## 2016-07-30 IMAGING — MR MR SHOULDER*R* W/O CM
5 series · 40 of 40 positions shown · non-contrast
Comparison: Radiographs dated 05/18/2015

CLINICAL DATA: Severe right shoulder pain with decreased range of
motion and weakness.

EXAM:
MRI OF THE RIGHT SHOULDER WITHOUT CONTRAST
TECHNIQUE: Multiplanar, multisequence MR imaging of the shoulder was performed.
No intravenous contrast was administered.

[Series 3: T2 fat-sat · axial · 4.0mm · 0.47mm/px · z∈[-3,+77]mm · 8 of 20 slices shown (1 of 3)]
[im 1/20]
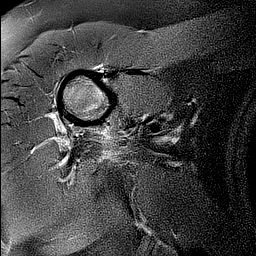
[im 3/20]
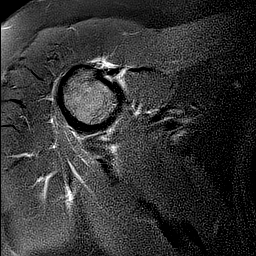
[im 6/20]
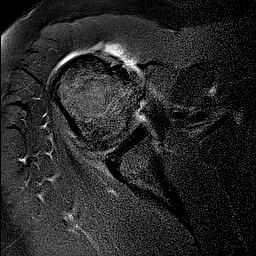
[im 9/20]
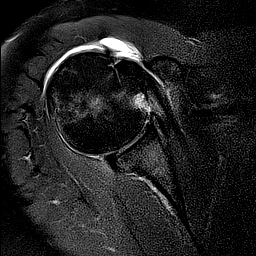
[im 11/20]
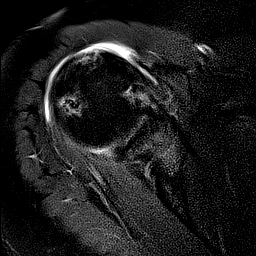
[im 14/20]
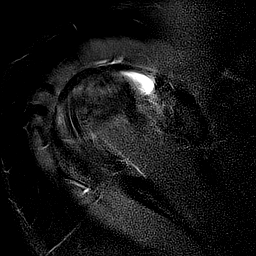
[im 17/20]
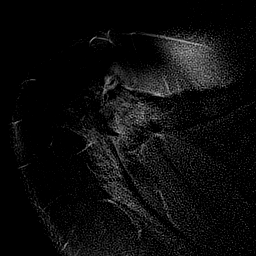
[im 20/20]
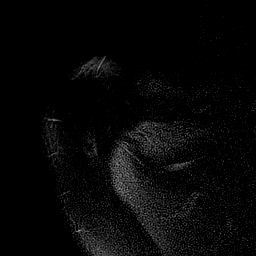

[Series 4: T2 fat-sat · oblique · 4.0mm · 0.62mm/px · 7 of 17 slices shown (2 of 3)]
[im 1/17]
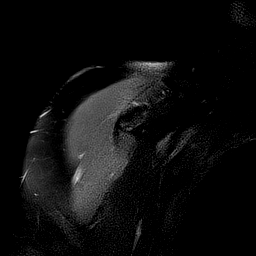
[im 3/17]
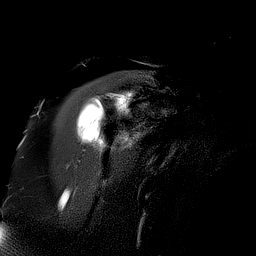
[im 6/17]
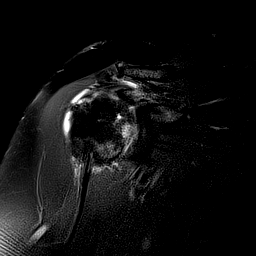
[im 9/17]
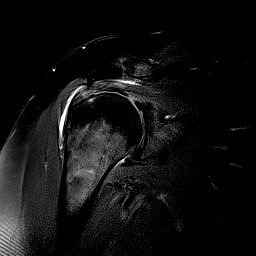
[im 11/17]
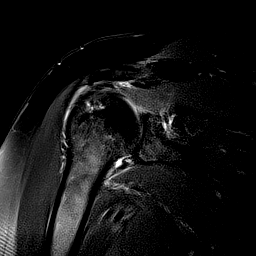
[im 14/17]
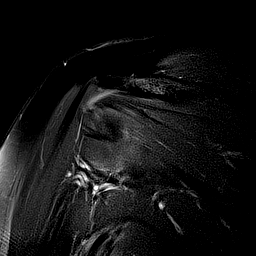
[im 17/17]
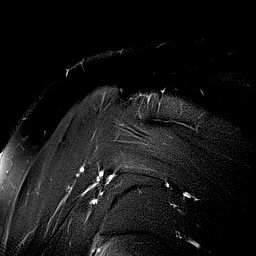

[Series 5: PD · oblique · 4.0mm · 0.62mm/px · 7 of 17 slices shown]
[im 1/17]
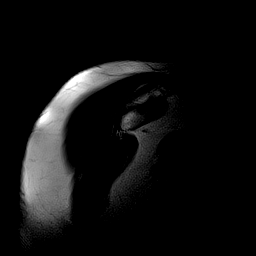
[im 3/17]
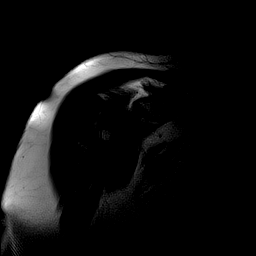
[im 6/17]
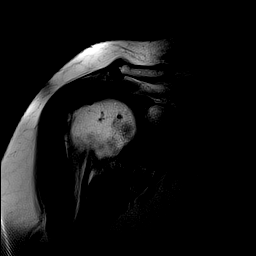
[im 9/17]
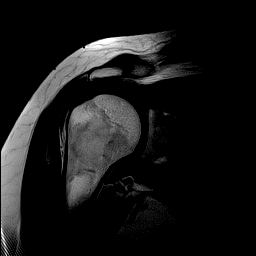
[im 11/17]
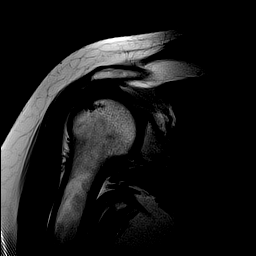
[im 14/17]
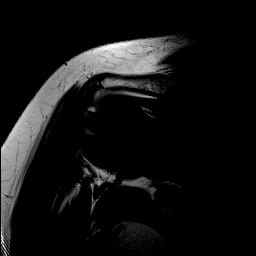
[im 17/17]
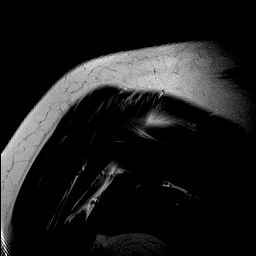

[Series 6: T1 · oblique · 4.0mm · 0.55mm/px · 9 of 20 slices shown]
[im 1/20]
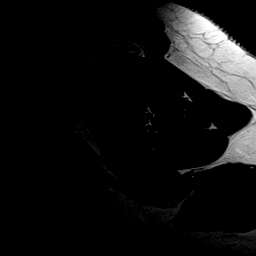
[im 3/20]
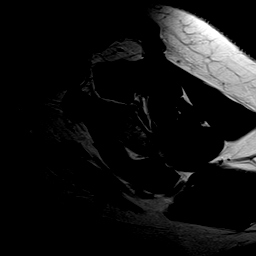
[im 5/20]
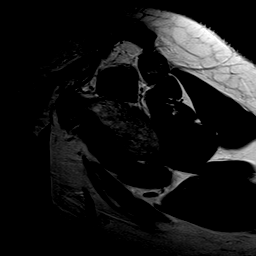
[im 8/20]
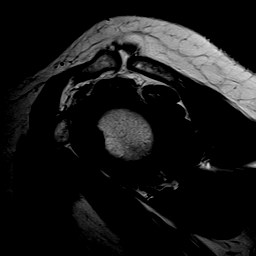
[im 10/20]
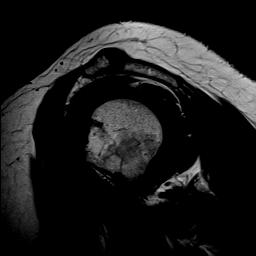
[im 12/20]
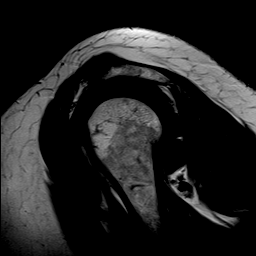
[im 15/20]
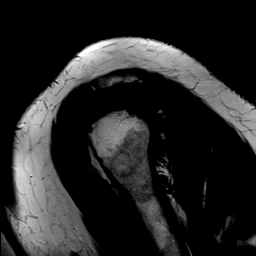
[im 17/20]
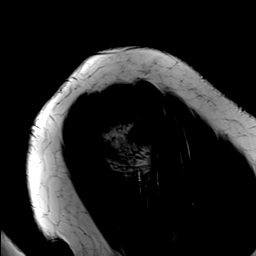
[im 20/20]
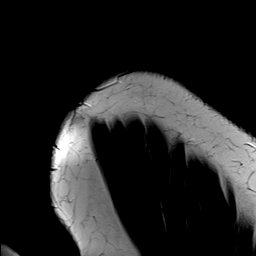

[Series 7: T2 fat-sat · oblique · 4.0mm · 0.55mm/px · 9 of 20 slices shown (3 of 3)]
[im 1/20]
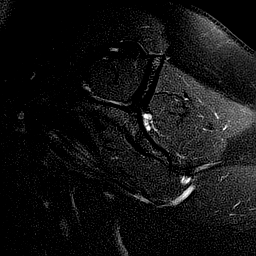
[im 3/20]
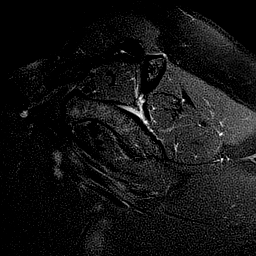
[im 5/20]
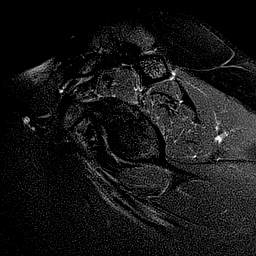
[im 8/20]
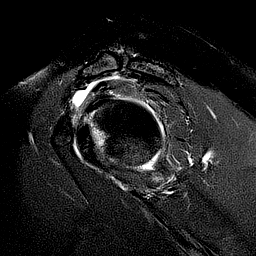
[im 10/20]
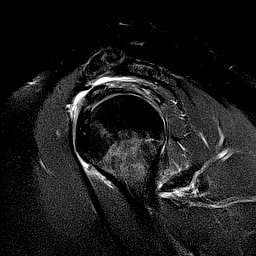
[im 12/20]
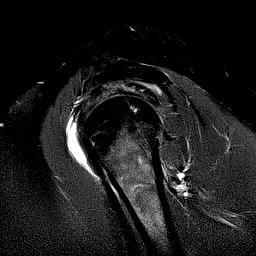
[im 15/20]
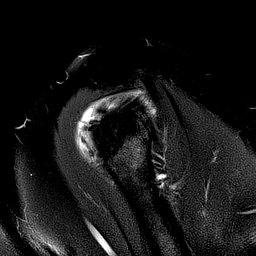
[im 17/20]
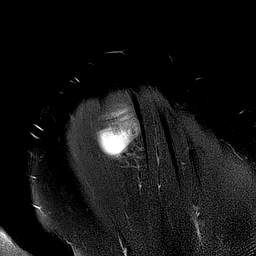
[im 20/20]
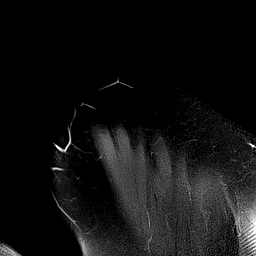

[40 of 40 positions shown; findings below may reference images not displayed]

FINDINGS: The patient has subacromial/subdeltoid bursitis primarily involving
the anterior aspect of the subdeltoid bursa.

Rotator cuff: There is a partial-thickness articular surface tear of
the distal supraspinous tendon seen on image 10 of series 4. There
is a small partial-thickness bursal surface tear of the anterior
aspect of the distal supraspinous best seen on images 6 and 7 of
series 7. The remainder of the rotator cuff is normal.

Muscles:  Normal.

Biceps long head:  Properly located and intact.

Acromioclavicular Joint: Minimal degenerative changes. Normal type 2
acromion.

Glenohumeral Joint: Normal.

Labrum:  Normal.

Bones: Focal area of sclerosis and subcortical cyst formation in the
posterior aspect of the greater tuberosity.
IMPRESSION: 1. Subacromial/subdeltoid bursitis.
2. Small articular surface and bursal surface partial thickness
tears of the distal supraspinatus tendon.

## 2017-07-11 ENCOUNTER — Ambulatory Visit: Payer: 59 | Admitting: Podiatry

## 2022-08-07 ENCOUNTER — Other Ambulatory Visit: Payer: Self-pay

## 2022-08-07 ENCOUNTER — Emergency Department: Payer: No Typology Code available for payment source

## 2022-08-07 ENCOUNTER — Emergency Department
Admission: EM | Admit: 2022-08-07 | Discharge: 2022-08-07 | Disposition: A | Discharge status: Home / Self Care | Payer: No Typology Code available for payment source | Attending: Emergency Medicine | Admitting: Emergency Medicine

## 2022-08-07 DIAGNOSIS — I10 Essential (primary) hypertension: Secondary | ICD-10-CM | POA: Diagnosis not present

## 2022-08-07 DIAGNOSIS — S161XXA Strain of muscle, fascia and tendon at neck level, initial encounter: Secondary | ICD-10-CM | POA: Diagnosis not present

## 2022-08-07 DIAGNOSIS — S40012A Contusion of left shoulder, initial encounter: Secondary | ICD-10-CM | POA: Insufficient documentation

## 2022-08-07 DIAGNOSIS — Y92410 Unspecified street and highway as the place of occurrence of the external cause: Secondary | ICD-10-CM | POA: Diagnosis not present

## 2022-08-07 DIAGNOSIS — S9002XA Contusion of left ankle, initial encounter: Secondary | ICD-10-CM | POA: Insufficient documentation

## 2022-08-07 DIAGNOSIS — M542 Cervicalgia: Secondary | ICD-10-CM | POA: Diagnosis present

## 2022-08-07 MED ORDER — ACETAMINOPHEN 500 MG PO TABS
1000.0000 mg | ORAL_TABLET | ORAL | Status: AC
  Administered 2022-08-07: 1000 mg via ORAL
  Filled 2022-08-07: qty 2

## 2022-08-07 MED ORDER — IBUPROFEN 600 MG PO TABS
600.0000 mg | ORAL_TABLET | Freq: Once | ORAL | Status: AC
  Administered 2022-08-07: 600 mg via ORAL
  Filled 2022-08-07: qty 1

## 2022-08-07 NOTE — ED Provider Notes (Signed)
Repeat EKG performed to the other 1 having artifact.  Interpreted by me at 2105 heart rate 50 QRS 80 QTc 440 Sinus bradycardia no evidence of ischemia   Delman Kitten, MD 08/07/22 2117
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
## 2023-11-05 ENCOUNTER — Emergency Department

## 2023-11-05 ENCOUNTER — Emergency Department: Admission: EM | Admit: 2023-11-05 | Discharge: 2023-11-05 | Disposition: A | Discharge status: Home / Self Care

## 2023-11-05 ENCOUNTER — Other Ambulatory Visit: Payer: Self-pay

## 2023-11-05 ENCOUNTER — Ambulatory Visit
Admission: EM | Admit: 2023-11-05 | Discharge: 2023-11-05 | Disposition: A | Discharge status: Home / Self Care | Attending: Emergency Medicine | Admitting: Emergency Medicine

## 2023-11-05 ENCOUNTER — Encounter: Payer: Self-pay | Admitting: Emergency Medicine

## 2023-11-05 DIAGNOSIS — M25562 Pain in left knee: Secondary | ICD-10-CM | POA: Diagnosis not present

## 2023-11-05 DIAGNOSIS — M47816 Spondylosis without myelopathy or radiculopathy, lumbar region: Secondary | ICD-10-CM | POA: Insufficient documentation

## 2023-11-05 DIAGNOSIS — R200 Anesthesia of skin: Secondary | ICD-10-CM

## 2023-11-05 DIAGNOSIS — M5441 Lumbago with sciatica, right side: Secondary | ICD-10-CM

## 2023-11-05 DIAGNOSIS — M5442 Lumbago with sciatica, left side: Secondary | ICD-10-CM

## 2023-11-05 DIAGNOSIS — G8929 Other chronic pain: Secondary | ICD-10-CM | POA: Diagnosis not present

## 2023-11-05 DIAGNOSIS — I10 Essential (primary) hypertension: Secondary | ICD-10-CM | POA: Diagnosis not present

## 2023-11-05 DIAGNOSIS — M545 Low back pain, unspecified: Secondary | ICD-10-CM | POA: Diagnosis present

## 2023-11-05 DIAGNOSIS — M542 Cervicalgia: Secondary | ICD-10-CM

## 2023-11-05 MED ORDER — CYCLOBENZAPRINE HCL 10 MG PO TABS
5.0000 mg | ORAL_TABLET | Freq: Once | ORAL | Status: AC
  Administered 2023-11-05: 5 mg via ORAL
  Filled 2023-11-05: qty 1

## 2023-11-05 MED ORDER — ACETAMINOPHEN 325 MG PO TABS
975.0000 mg | ORAL_TABLET | Freq: Once | ORAL | Status: AC
  Administered 2023-11-05: 975 mg via ORAL

## 2023-11-05 MED ORDER — PREDNISONE 50 MG PO TABS: ORAL_TABLET | ORAL | 0 refills | Status: DC

## 2023-11-05 MED ORDER — TIZANIDINE HCL 4 MG PO TABS: 4.0000 mg | ORAL_TABLET | Freq: Every evening | ORAL | 1 refills | Status: DC | PRN

## 2023-11-05 MED ORDER — NAPROXEN 500 MG PO TBEC: 500.0000 mg | DELAYED_RELEASE_TABLET | Freq: Two times a day (BID) | ORAL | 0 refills | Status: DC

## 2023-11-05 MED ORDER — KETOROLAC TROMETHAMINE 30 MG/ML IJ SOLN
60.0000 mg | Freq: Once | INTRAMUSCULAR | Status: DC
  Filled 2023-11-05: qty 2

## 2023-11-05 MED ORDER — IBUPROFEN 800 MG PO TABS
800.0000 mg | ORAL_TABLET | Freq: Once | ORAL | Status: AC
  Administered 2023-11-05: 800 mg via ORAL

## 2023-11-05 MED ORDER — PREDNISONE 20 MG PO TABS
60.0000 mg | ORAL_TABLET | Freq: Once | ORAL | Status: AC
  Administered 2023-11-05: 60 mg via ORAL
  Filled 2023-11-05: qty 3

## 2023-11-05 NOTE — ED Provider Notes (Addendum)
 [facility] Provider Note    Event Date/Time   First MD Initiated Contact with Patient 11/05/23 [phone]     (approximate)   History   Back Pain    HPI  Diane Case is a 68 y.o. female with a past medical history of essential hypertension, prediabetes, cervicalgia, radicular pain in right arm and plantar fasciitis  who presents to the ED complaining of lumbar pain. According to the patient, symptoms started 3 months ago but worse in the last 3 days, lumbar pain  radiates to the left knee, right buttock.  Pain increased with getting up from bed, sitting up.  Patient states having numbness in both legs but denies urinary incontinence, fecal incontinence, urinary retention, saddle anesthesia. Patient went to [facility] urgent care today they referred her for concerns of cauda equina.  Patient received ibuprofen  800 mg and Tylenol  at urgent care today.  Patient is allergic to tramadol, oxycodone , sulfas.  Patient did not have any imaging today at [facility].  Patient also complains of left cervicalgia that radiates to her left arm, patient states numbness on the left arm.  Patient denies chest pain, jaw pain.  Patient states having previous history of lumbar surgery in 2005.      Physical Exam   Triage Vital Signs: ED Triage Vitals  Encounter Vitals Group     BP 11/05/23 1554 (!) 184/72     Systolic BP Percentile --      Diastolic BP Percentile --      Pulse Rate 11/05/23 1552 70     Resp 11/05/23 1552 20     Temp 11/05/23 1552 97.9 F (36.6 C)     Temp Source 11/05/23 1552 Oral     SpO2 11/05/23 1552 100 %     Weight 11/05/23 1553 244 lb (110.7 kg)     Height 11/05/23 1553 5\' 5"  (1.651 m)     Head Circumference --      Peak Flow --      Pain Score 11/05/23 1551 10     Pain Loc --      Pain Education --      Exclude from Growth Chart --     Most recent vital signs: Vitals:   11/05/23 1552 11/05/23 1554  BP:  (!) 184/72  Pulse: 70   Resp: 20   Temp: 97.9 F  (36.6 C)   SpO2: 100%      Constitutional: Alert, NAD. Able to speak in complete sentences without cough or dyspnea  Eyes: Conjunctivae are normal.  Head: Atraumatic. Nose: No congestion/rhinnorhea. Mouth/Throat: Mucous membranes are moist.   Neck: Painless ROM. Supple. No JVD, nodes, thyromegaly  Cardiovascular:   Good peripheral circulation.RRR no murmurs, gallops, rubs  Respiratory: Normal respiratory effort.  No retractions. Clear to auscultation bilaterally without wheezing or crackles  Gastrointestinal: Soft and nontender.  Musculoskeletal: Cervical spine: Skin is intact no ecchymosis no hematomas, spasmatic trapezius, tender to palpation.   Lumbar spine: Skin intact no ecchymosis no hematomas.  Tender to palpation in the spinal process and paraspinal muscles.  Sensation is intact, strength 5/5, pulses positive no signs of saddle anesthesia.  Bilateral straight leg raise negative. Left knee : Skin is intact no ecchymosis no hematomas no scars, no deformity.  Tender to palpation in patellar area, flexion limited by pain mild prepatellar edema Neurologic:  MAE spontaneously. No gross focal neurologic deficits are appreciated.  Skin:  Skin is warm, dry and intact. No rash noted. Psychiatric: Mood  and affect are normal. Speech and behavior are normal.    ED Results / Procedures / Treatments   Labs (all labs ordered are listed, but only abnormal results are displayed) Labs Reviewed - No data to display  EKG See physician read  RADIOLOGY I independently reviewed and interpreted imaging and agree with radiologists findings.   PROCEDURES:  Critical Care performed:   Procedures   MEDICATIONS ORDERED IN ED: Medications  predniSONE (DELTASONE) tablet 60 mg (60 mg Oral Given 11/05/23 1701)  cyclobenzaprine (FLEXERIL) tablet 5 mg (5 mg Oral Given 11/05/23 1727)   Clinical Course as of 11/05/23 1902  Sun Nov 05, 2023  1711 According to the nurse patient does not want IM  medication I discontinue Toradol and ordered Flexeril [AE]  1833 DG Knee Complete 4 Views Left Trace patellofemoral spurring. Small quadriceps tendon enthesophyte. [AE]  1834 DG Lumbar Spine Complete Posterior rod and pedicle screw fixation L4-L5. Lucency about the L4 pedicle screws up to 2 mm, suspicious for loosening. 2. Mild degenerative disc disease at L3-L4. 3. Facet hypertrophy at L2-L3 and L3-L4. 4. Moderate degenerative change of both sacroiliac joints.   [AE]  1851 I updated patient with results of x-ray of knee and lumbar spine.  Patient is going to be referred to neurosurgery and orthopedics for a follow-up.  He is going to be discharged with muscle relaxant, pain medicine and steroids [AE]    Clinical Course User Index [AE] Awilda Lennox, PA-C    IMPRESSION / MDM / ASSESSMENT AND PLAN / ED COURSE  I reviewed the triage vital signs and the nursing notes.  Differential diagnosis includes, but is not limited to, cervical radiculopathy, muscle strain, fracture, knee effusion, disc herniation or spinal stenosis.Aaron Aasarmced  Patient's presentation is most consistent with acute complicated illness / injury requiring diagnostic workup.  Patient's diagnosis is consistent with left patellofemoral spurring, L4 screw suspicious for loosening, facet hypertrophy at L2-L3 and L3-L4.  Left cervicalgia. I independently reviewed and interpreted imaging and agree with radiologists findings ruling out fractures, disc herniation or spinal stenosis. I did review the patient's allergies and medications.I did not order MRI patient does not have symptoms of saddle anesthesia ruling out cauda equina.  During admission patient received Flexeril, steroids.  The patient is in stable and satisfactory condition for discharge home  Patient will be discharged home with prescriptions for tizanidine , prednisone, naproxen. Patient is to follow up with orthopedics, neurosurgery as needed or otherwise directed. Patient  is given ED precautions to return to the ED for any worsening or new symptoms. Discussed plan of care with patient, answered all of patient's questions, Patient agreeable to plan of care. Advised patient to take medications according to the instructions on the label. Discussed possible side effects of new medications. Patient verbalized understanding.    FINAL CLINICAL IMPRESSION(S) / ED DIAGNOSES   Final diagnoses:  Facet hypertrophy of lumbar region  Left knee pain, unspecified chronicity  Other chronic back pain     Rx / DC Orders   ED Discharge Orders          Ordered    tiZANidine  (ZANAFLEX ) 4 MG tablet  At bedtime PRN        11/05/23 1858    predniSONE (DELTASONE) 50 MG tablet        11/05/23 1858    naproxen (EC NAPROSYN) 500 MG EC tablet  2 times daily with meals        11/05/23 1858  Note:  This document was prepared using Dragon voice recognition software and may include unintentional dictation errors.   Awilda Lennox, PA-C 11/05/23 1902    Awilda Lennox, PA-C 11/05/23 1907    Collis Deaner, MD 11/05/23 [phone]

## 2023-11-05 NOTE — ED Triage Notes (Signed)
 Pt to ED from [facility] for lower back pain since last 2-3 months, worse since 2-3 days ago. Pain is sharp and constant. Sometimes pain shoots down hips. Sometimes legs fall asleep while sitting at work. Denies new incontinence. No urinary symptoms.

## 2023-11-05 NOTE — ED Provider Notes (Signed)
 HPI  SUBJECTIVE:  Diane Case is a 68 y.o. female who presents with days of constant, shooting bilateral low back pain that goes down both of her legs.  She reports bilateral foot numbness this morning.  She states the pain is worse at night.  She does a lot of mopping at work, but has no other change in physical activity.  She denies leg weakness/giving way, saddle anesthesia, urinary/fecal incontinence, urinary retention, trauma, recent fall, fevers, urinary complaints, abdominal pain, night sweats, weight loss, syncope.  This is the worst pain that she has ever had.  She has tried [facility] powders, Tylenol , Excedrin and uses muscle relaxant without improvement in her symptoms.  No antipyretic in the past 6 hours.  No alleviating factors.  Symptoms are worse with sitting for a prolonged period of time and after mopping.  She has a history of degenerative disc disease L-spine, L-spine surgery in 2005.  No formal diagnosis of hypertension.  No history of aortic abdominal aneurysm, HIV, IV drug use, cancer/multiple myeloma PUD, GI bleed.  PCP: [facility].   Past Medical History:  Diagnosis Date   Allergy     Past Surgical History:  Procedure Laterality Date   ABDOMINAL HYSTERECTOMY  2003   BACK SURGERY  2005   COLONOSCOPY N/A 11/28/2014   Procedure: COLONOSCOPY;  Surgeon: Marnee Sink, MD;  Location: [facility];  Service: Gastroenterology;  Laterality: N/A;   FOOT SURGERY Right    POLYPECTOMY  11/28/2014   Procedure: POLYPECTOMY INTESTINAL;  Surgeon: Marnee Sink, MD;  Location: [facility];  Service: Gastroenterology;;    Family History  Problem Relation Age of Onset   Cancer Mother    Multiple sclerosis Mother    Heart attack Father    Cancer Sister        lung    Social History   Tobacco Use   Smoking status: Former    Current packs/day: 0.00    Types: Cigarettes    Quit date: 11/20/2002    Years since quitting: 20.9   Smokeless tobacco: Former    Quit  date: 11/20/1983  Vaping Use   Vaping status: Never Used  Substance Use Topics   Alcohol use: No    Alcohol/week: 0.0 standard drinks of alcohol   Drug use: No    No current facility-administered medications for this encounter.  Current Outpatient Medications:    Ascorbic Acid (VITAMIN C) 100 MG tablet, Take 100 mg by mouth daily., Disp: , Rfl:    metaxalone  (SKELAXIN ) 800 MG tablet, Take 1 tablet (800 mg total) by mouth 3 (three) times daily., Disp: 30 tablet, Rfl: 0  Allergies  Allergen Reactions   Cephalexin Itching    Itching   Hydrocodone -Acetaminophen  Nausea Only   Oxycodone -Acetaminophen  Nausea Only   Sulfa Antibiotics Rash   Tramadol Rash    sedating     ROS  As noted in HPI.   Physical Exam  BP (!) 159/90 (BP Location: Left Arm)   Pulse (!) 53   Temp 97.9 F (36.6 C) (Oral)   Resp 18   Ht 5\' 5"  (1.651 m)   Wt 112.5 kg   LMP  (LMP Unknown)   SpO2 99%   BMI 41.27 kg/m   Constitutional: Well developed, well nourished, no acute distress Eyes:  EOMI, conjunctiva normal bilaterally HENT: Normocephalic, atraumatic,mucus membranes moist Respiratory: Normal inspiratory effort Cardiovascular: Normal rate GI: nondistended. No suprapubic tenderness skin: No rash, skin intact Musculoskeletal: no CVAT.  Bilateral paralumbar tenderness,  positive muscle spasm.  Tenderness at L4/5, bilateral SIJ tenderness.  No sacral bony tenderness. Bilateral lower extremities nontender, baseline ROM with intact DP pulses.  Sensation between thighs intact.  Sensation left big toe diminished.  Pain with active hip flexion against resistance bilaterally.  No pain with extension bilaterally no pain with passive int/ext rotation, AD/ABduction bilaterally. SLR neg bilaterally. Sensation baseline light touch bilaterally for Pt, DTR's symmetric and intact bilaterally KJ / AJ, Motor symmetric bilateral 5/5 hip flexion, quadriceps, hamstrings, EHL, foot dorsiflexion, foot plantarflexion, gait  somewhat antalgic but without apparent new ataxia. Neurologic: Alert & oriented x 3, no focal neuro deficits Psychiatric: Speech and behavior appropriate   ED Course   Medications  acetaminophen  (TYLENOL ) tablet 975 mg (975 mg Oral Given 11/05/23 1409)  ibuprofen  (ADVIL ) tablet 800 mg (800 mg Oral Given 11/05/23 1409)    No orders of the defined types were placed in this encounter.   No results found for this or any previous visit (from the past 24 hours). No results found.  ED Clinical Impression  1. Acute bilateral low back pain with bilateral sciatica   2. Numbness of toes      ED Assessment/Plan     Patient presents with several red flags including bony tenderness, bilateral radicular pain, new bilateral numbness in her feet with objective findings of decreased sensation in her left big toe, pain worse at night.  I am concerned that she could have a herniated disc, or very early cauda equina.  Giving 800 mg of ibuprofen  and 925 mg Tylenol  here.  Transferring to the emergency department for comprehensive evaluation and possible specialty evaluation.  She is stable to go by private vehicle.  Gave report to Odilia Bennett, Press photographer.  Discussed rationale for transfer to the emergency department with patient.  She patient agrees with plan.  Meds ordered this encounter  Medications   acetaminophen  (TYLENOL ) tablet 975 mg   ibuprofen  (ADVIL ) tablet 800 mg    *This clinic note was created using Scientist, clinical (histocompatibility and immunogenetics). Therefore, there may be occasional mistakes despite careful proofreading.  ?     Ethlyn Herd, MD 11/05/23 1416

## 2023-11-05 NOTE — ED Notes (Signed)
 Patient is being discharged from the Urgent Care and sent to the [facility] Emergency Department via private vehicle . Per Dr. Mauricia South, patient is in need of higher level of care due to do numbness in her feet. Patient is aware and verbalizes understanding of plan of care.  Vitals:   11/05/23 1140  BP: (!) 159/90  Pulse: (!) 53  Resp: 18  Temp: 97.9 F (36.6 C)  SpO2: 99%

## 2023-11-05 NOTE — Discharge Instructions (Addendum)
 You have been diagnosed with chronic back pain due to facet hypertrophy at L2-L3 and L3-L4.  You do have knee pain due to left patellofemoral spurring, L5 4 screw suspicious for loosening.  Please call neurosurgery make an appointment for your back pain, call orthopedic doctors for your knee pain follow-up.  Please take prednisone 1 tablet with breakfast daily, please take Zanaflex  1 tablet by mouth at bedtime.  This take naproxen 1 tablet by mouth every 12 hours with meals.  If you have new symptoms or symptoms worsen please come back or go to your PCP

## 2023-11-05 NOTE — ED Triage Notes (Addendum)
 Pt c/o lower back pain that radiates down buttock into her thighs. Started about a week ago. She states this morning her feet felt numb. Pt has h/o chronic LBP and has had lumbar back surgery.

## 2023-11-05 NOTE — Discharge Instructions (Signed)
 I have given you 800 mg of ibuprofen  and 975 mg of Tylenol  here.  I am concerned that may have a herniated disc or early cauda equina.  I am transferring you to the emergency department for comprehensive evaluation and possible specialty consultation if deemed necessary.  I will call them and let them know that you are on the way.

## 2023-11-17 NOTE — Progress Notes (Unsigned)
 Referring Physician:  No referring provider defined for this encounter.  Primary Physician:  Pcp, No  History of Present Illness: 11/22/2023 [name] is here today with a chief complaint of acute on chronic low back pain.  Previously underwent a posterior spinal fusion in 2005.  She states she had good relief for approximately 3 to 4 years for having pain again.  Over the course of the past 3 months her pain has become severe in her back radiating to about the level of her knee and bilateral lower extremities.  She complains of intense cramping in both legs, worse on the right.  If she tries to go shopping she often has to lean over the cart or has to sit down.  She cannot be in 1 position for prolonged period of time without pain.  She denies any weakness in her legs.  She denies any saddle anesthesia or frank incontinence.    Duration: 3 months Weakness: none Bowel/Bladder Dysfunction: none  Conservative measures:  Physical therapy: has not participated in PT  Multimodal medical therapy including regular antiinflammatories: Tizanidine , Prednisone , Naproxen   Injections: no epidural steroid injections  Past Surgery: Lumbar spinal fusion in 2005.  Diane Case has no symptoms of cervical myelopathy.  The symptoms are causing a significant impact on the patient's life.   Review of Systems:  A 10 point review of systems is negative, except for the pertinent positives and negatives detailed in the HPI.  Past Medical History: Past Medical History:  Diagnosis Date   Allergy     Past Surgical History: Past Surgical History:  Procedure Laterality Date   ABDOMINAL HYSTERECTOMY  2003   BACK SURGERY  2005   COLONOSCOPY N/A 11/28/2014   Procedure: COLONOSCOPY;  Surgeon: Marnee Sink, MD;  Location: [facility];  Service: Gastroenterology;  Laterality: N/A;   FOOT SURGERY Right    POLYPECTOMY  11/28/2014   Procedure: POLYPECTOMY INTESTINAL;  Surgeon: Marnee Sink,  MD;  Location: [facility];  Service: Gastroenterology;;    Allergies: Allergies as of 11/22/2023 - Review Complete 11/22/2023  Allergen Reaction Noted   Cephalexin Itching 11/11/2014   Hydrocodone -acetaminophen  Nausea Only 11/11/2014   Oxycodone -acetaminophen  Nausea Only 11/11/2014   Naproxen  Nausea Only 11/22/2023   Sulfa antibiotics Rash 09/17/2014   Tramadol Rash 11/11/2014    Medications: Outpatient Encounter Medications as of 11/22/2023  Medication Sig   acetaminophen  (TYLENOL ) 500 MG tablet Take 500 mg by mouth every 6 (six) hours as needed for moderate pain (pain score 4-6).   Ascorbic Acid (VITAMIN C) 100 MG tablet Take 100 mg by mouth daily.   diclofenac  Sodium (VOLTAREN ) 1 % GEL Apply 2 g topically 3 (three) times daily as needed.   methocarbamol (ROBAXIN) 500 MG tablet Take 1 tablet (500 mg total) by mouth every 8 (eight) hours as needed for muscle spasms.   Multiple Vitamins-Minerals (B COMPLEX-C-E-ZINC) tablet Take 1 tablet by mouth daily.   [DISCONTINUED] meloxicam  (MOBIC ) 7.5 MG tablet Take 2 tablets (15 mg total) by mouth daily.   [DISCONTINUED] metaxalone  (SKELAXIN ) 800 MG tablet Take 1 tablet (800 mg total) by mouth 3 (three) times daily. (Patient not taking: Reported on 11/22/2023)   [DISCONTINUED] naproxen  (EC NAPROSYN ) 500 MG EC tablet Take 1 tablet (500 mg total) by mouth 2 (two) times daily with a meal. (Patient not taking: Reported on 11/22/2023)   [DISCONTINUED] predniSONE  (DELTASONE ) 50 MG tablet Take 1 pill with breakfast (Patient not taking: Reported on 11/22/2023)   [DISCONTINUED] tiZANidine  (  ZANAFLEX ) 4 MG tablet Take 1 tablet (4 mg total) by mouth at bedtime as needed for muscle spasms. (Patient not taking: Reported on 11/22/2023)   [DISCONTINUED] diclofenac  Sodium (VOLTAREN ) 1 % topical gel 2 g    No facility-administered encounter medications on file as of 11/22/2023.    Social History: Social History   Tobacco Use   Smoking status: Former     Current packs/day: 0.00    Types: Cigarettes    Quit date: 11/20/2002    Years since quitting: 21.0   Smokeless tobacco: Former    Quit date: 11/20/1983  Vaping Use   Vaping status: Never Used  Substance Use Topics   Alcohol use: No    Alcohol/week: 0.0 standard drinks of alcohol   Drug use: No    Family Medical History: Family History  Problem Relation Age of Onset   Cancer Mother    Multiple sclerosis Mother    Heart attack Father    Cancer Sister        lung    Physical Examination: @VITALWITHPAIN @  General: Patient is well developed, well nourished, calm, collected, and in no apparent distress. Attention to examination is appropriate.  Psychiatric: Patient is non-anxious.  Head:  Pupils equal, round, and reactive to light.  ENT:  Oral mucosa appears well hydrated.  Neck:   Supple.  Full range of motion.  Respiratory: Patient is breathing without any difficulty.  Extremities: No edema.  Vascular: Palpable dorsal pedal pulses.  Skin:   On exposed skin, there are no abnormal skin lesions.  NEUROLOGICAL:     Awake, alert, oriented to person, place, and time.  Speech is clear and fluent. Fund of knowledge is appropriate.   Cranial Nerves: Pupils equal round and reactive to light.  Facial tone is symmetric.    ROM of spine: Patient has limited range of motion her lumbar spine.  She is very tender to palpation of her lumbar paraspinals.  Strength:  Side Iliopsoas Quads Hamstring PF DF EHL  R 5 5 5 5 5 5   L 5 5 5 5 5 5     Reflexes are 1+ at bilateral patella Hoffman's is absent.  Clonus is not present.  Toes are down-going.  Bilateral upper and lower extremity sensation is intact to light touch.    Patient walks with a forward leaning gait.  Medical Decision Making  Imaging:  EXAM: LUMBAR SPINE - COMPLETE 4+ VIEW   COMPARISON:  None Available.   FINDINGS: Five non-rib-bearing lumbar vertebra. Straightening of normal lordosis. No listhesis.  Posterior rod and pedicle screw fixation L4-L5. Lucency about the L4 pedicle screws up to 2 mm. Slight L3-L4 disc space narrowing. L2-L3 and L3-L4 facet hypertrophy. No evidence of fracture or focal bone abnormality. Moderate degenerative change of both sacroiliac joints.   IMPRESSION: 1. Posterior rod and pedicle screw fixation L4-L5. Lucency about the L4 pedicle screws up to 2 mm, suspicious for loosening. 2. Mild degenerative disc disease at L3-L4. 3. Facet hypertrophy at L2-L3 and L3-L4. 4. Moderate degenerative change of both sacroiliac joints.      I have personally reviewed the images and agree with the above interpretation.  Assessment and Plan: Diane Case is a pleasant 68 y.o. female is here today with a chief complaint of acute on chronic low back pain.  Previously underwent a posterior spinal fusion in 2005.  She states she had good relief for approximately 3 to 4 years for having pain again.  Over the course of the past  3 months her pain has become severe in her back radiating to about the level of her knee and bilateral lower extremities.  She complains of intense cramping in both legs, worse on the right.  If she tries to go shopping she often has to lean over the cart or has to sit down.  She cannot be in one position for prolonged period of time without pain.  She denies any weakness in her legs.  On examination, patient is very tender to palpation of her lumbar paraspinals.  She has full strength.  Most recent lumbar spine x-rays showed potential lucency at the L4 pedicle screws.  Unsure if this is actually indicative of loosening at this time.  It was a pleasure to see patient in clinic today.  Plan moving forward includes the following:  -Patient with severe muscle cramps.  This could be due to neurogenic claudication however would like patient to have magnesium checked.  In addition, patient is currently taking tizanidine  and does not feel as though it is working at all.  I  have changed medication to Robaxin to try to help relieve her pain. - Patient states that she previously had a lot of success with Voltaren  gel some of her back pain.  I was happy to reorder this for her. - Would like patient to undergo additional x-rays today to include flexion and extension of her lumbar spine.  In addition would like patient to undergo MRI of her lumbar spine.  Counseled patient that there is a high likelihood we would need a CT scan in the future as well. - Patient is unable to go to formal physical therapy due to working 2 jobs at this time.  A home exercise program was given to her and discussed with her at length.  Once imaging is complete, we will review and discuss further with the patient.   Thank you for involving me in the care of this patient.   I spent a total of 60 minutes in both face-to-face and non-face-to-face activities for this visit on the date of this encounter including repairing to see the patient, reviewing x-rays, obtaining history, performing medical examination, counseling the patient, ordering additional tests and medications, documenting clinical information, independently interpreting results.  Ludwig Safer, PA-C Dept. of Neurosurgery

## 2023-11-22 ENCOUNTER — Encounter: Payer: Self-pay | Admitting: Physician Assistant

## 2023-11-22 ENCOUNTER — Ambulatory Visit (INDEPENDENT_AMBULATORY_CARE_PROVIDER_SITE_OTHER): Admitting: Physician Assistant

## 2023-11-22 ENCOUNTER — Other Ambulatory Visit
Admission: RE | Admit: 2023-11-22 | Discharge: 2023-11-22 | Disposition: A | Discharge status: Home / Self Care | Source: Ambulatory Visit | Attending: Physician Assistant | Admitting: Physician Assistant

## 2023-11-22 VITALS — BP 122/80 | Ht 65.0 in | Wt 246.8 lb

## 2023-11-22 DIAGNOSIS — R252 Cramp and spasm: Secondary | ICD-10-CM | POA: Insufficient documentation

## 2023-11-22 DIAGNOSIS — G8929 Other chronic pain: Secondary | ICD-10-CM | POA: Diagnosis not present

## 2023-11-22 DIAGNOSIS — M5441 Lumbago with sciatica, right side: Secondary | ICD-10-CM

## 2023-11-22 DIAGNOSIS — M5442 Lumbago with sciatica, left side: Secondary | ICD-10-CM

## 2023-11-22 DIAGNOSIS — Z981 Arthrodesis status: Secondary | ICD-10-CM

## 2023-11-22 LAB — MAGNESIUM: Magnesium: 2.2 mg/dL (ref 1.7–2.4)

## 2023-11-22 MED ORDER — MELOXICAM 7.5 MG PO TABS: 15.0000 mg | ORAL_TABLET | Freq: Every day | ORAL | 2 refills | Status: DC

## 2023-11-22 MED ORDER — DICLOFENAC SODIUM 1 % EX GEL: 2.0000 g | Freq: Three times a day (TID) | CUTANEOUS | 2 refills | Status: AC | PRN

## 2023-11-22 MED ORDER — DICLOFENAC SODIUM 1 % EX GEL: 2.0000 g | Freq: Three times a day (TID) | CUTANEOUS | Status: DC | PRN

## 2023-11-22 MED ORDER — METHOCARBAMOL 500 MG PO TABS: 500.0000 mg | ORAL_TABLET | Freq: Three times a day (TID) | ORAL | 0 refills | Status: AC | PRN

## 2023-11-24 ENCOUNTER — Ambulatory Visit
Admission: RE | Admit: 2023-11-24 | Discharge: 2023-11-24 | Disposition: A | Discharge status: Home / Self Care | Source: Ambulatory Visit | Attending: Physician Assistant | Admitting: Physician Assistant

## 2023-11-24 DIAGNOSIS — M5442 Lumbago with sciatica, left side: Secondary | ICD-10-CM | POA: Insufficient documentation

## 2023-11-24 DIAGNOSIS — R252 Cramp and spasm: Secondary | ICD-10-CM

## 2023-11-24 DIAGNOSIS — G8929 Other chronic pain: Secondary | ICD-10-CM | POA: Insufficient documentation

## 2023-11-24 DIAGNOSIS — Z981 Arthrodesis status: Secondary | ICD-10-CM | POA: Diagnosis present

## 2023-11-24 DIAGNOSIS — M5441 Lumbago with sciatica, right side: Secondary | ICD-10-CM | POA: Diagnosis present

## 2023-12-05 ENCOUNTER — Ambulatory Visit
Admission: RE | Admit: 2023-12-05 | Discharge: 2023-12-05 | Disposition: A | Discharge status: Home / Self Care | Source: Ambulatory Visit | Attending: Physician Assistant | Admitting: Physician Assistant

## 2023-12-05 DIAGNOSIS — Z981 Arthrodesis status: Secondary | ICD-10-CM

## 2023-12-05 DIAGNOSIS — G8929 Other chronic pain: Secondary | ICD-10-CM

## 2023-12-05 DIAGNOSIS — R252 Cramp and spasm: Secondary | ICD-10-CM

## 2023-12-25 ENCOUNTER — Ambulatory Visit: Payer: Self-pay | Admitting: Physician Assistant
